# Patient Record
Sex: Female | Born: 1955 | Race: White | Hispanic: No | Smoking: Current every day smoker
Health system: Southern US, Community
[De-identification: ages and names within clinical notes are randomized; demographics above are authoritative.]

## PROBLEM LIST (undated history)

## (undated) DIAGNOSIS — E785 Hyperlipidemia, unspecified: Secondary | ICD-10-CM

## (undated) DIAGNOSIS — J45909 Unspecified asthma, uncomplicated: Secondary | ICD-10-CM

## (undated) DIAGNOSIS — Z860100 Personal history of colon polyps, unspecified: Secondary | ICD-10-CM

## (undated) DIAGNOSIS — M797 Fibromyalgia: Secondary | ICD-10-CM

## (undated) DIAGNOSIS — M1990 Unspecified osteoarthritis, unspecified site: Secondary | ICD-10-CM

## (undated) DIAGNOSIS — M109 Gout, unspecified: Secondary | ICD-10-CM

## (undated) DIAGNOSIS — I10 Essential (primary) hypertension: Secondary | ICD-10-CM

## (undated) DIAGNOSIS — F419 Anxiety disorder, unspecified: Secondary | ICD-10-CM

## (undated) DIAGNOSIS — M549 Dorsalgia, unspecified: Secondary | ICD-10-CM

## (undated) DIAGNOSIS — G8929 Other chronic pain: Secondary | ICD-10-CM

## (undated) DIAGNOSIS — I2510 Atherosclerotic heart disease of native coronary artery without angina pectoris: Secondary | ICD-10-CM

## (undated) DIAGNOSIS — N393 Stress incontinence (female) (male): Secondary | ICD-10-CM

## (undated) DIAGNOSIS — Z86711 Personal history of pulmonary embolism: Secondary | ICD-10-CM

## (undated) HISTORY — DX: Other chronic pain: G89.29

## (undated) HISTORY — DX: Dorsalgia, unspecified: M54.9

## (undated) HISTORY — DX: Gout, unspecified: M10.9

## (undated) HISTORY — PX: LUMBAR SPINE SURGERY: SHX701

## (undated) HISTORY — DX: Personal history of colon polyps, unspecified: Z86.0100

## (undated) HISTORY — DX: Personal history of pulmonary embolism: Z86.711

## (undated) HISTORY — DX: Hyperlipidemia, unspecified: E78.5

## (undated) HISTORY — PX: CHOLECYSTECTOMY: SHX55

## (undated) HISTORY — PX: CERVICAL DISC SURGERY: SHX588

## (undated) HISTORY — DX: Atherosclerotic heart disease of native coronary artery without angina pectoris: I25.10

## (undated) HISTORY — PX: TOTAL SHOULDER REPLACEMENT: SUR1217

## (undated) HISTORY — DX: Anxiety disorder, unspecified: F41.9

## (undated) HISTORY — PX: ABDOMINAL HYSTERECTOMY: SHX81

## (undated) HISTORY — DX: Essential (primary) hypertension: I10

## (undated) HISTORY — DX: Personal history of colonic polyps: Z86.010

## (undated) HISTORY — DX: Fibromyalgia: M79.7

## (undated) HISTORY — DX: Unspecified osteoarthritis, unspecified site: M19.90

## (undated) HISTORY — PX: TOTAL KNEE ARTHROPLASTY: SHX125

## (undated) HISTORY — DX: Stress incontinence (female) (male): N39.3

---

## 2006-11-06 ENCOUNTER — Ambulatory Visit (HOSPITAL_COMMUNITY): Admission: RE | Admit: 2006-11-06 | Discharge: 2006-11-06 | Payer: Self-pay | Admitting: Family Medicine

## 2006-11-14 ENCOUNTER — Encounter: Admission: RE | Admit: 2006-11-14 | Discharge: 2006-11-14 | Payer: Self-pay | Admitting: Family Medicine

## 2006-11-27 ENCOUNTER — Ambulatory Visit (HOSPITAL_COMMUNITY): Admission: RE | Admit: 2006-11-27 | Discharge: 2006-11-27 | Payer: Self-pay | Admitting: Family Medicine

## 2006-12-19 ENCOUNTER — Encounter (HOSPITAL_COMMUNITY): Admission: RE | Admit: 2006-12-19 | Discharge: 2007-01-18 | Payer: Self-pay | Admitting: Chiropractic Medicine

## 2007-09-14 ENCOUNTER — Inpatient Hospital Stay (HOSPITAL_COMMUNITY): Admission: EM | Admit: 2007-09-14 | Discharge: 2007-09-15 | Payer: Self-pay | Admitting: Emergency Medicine

## 2008-01-16 ENCOUNTER — Inpatient Hospital Stay (HOSPITAL_COMMUNITY): Admission: RE | Admit: 2008-01-16 | Discharge: 2008-01-21 | Payer: Self-pay | Admitting: Orthopedic Surgery

## 2008-01-19 ENCOUNTER — Encounter (INDEPENDENT_AMBULATORY_CARE_PROVIDER_SITE_OTHER): Payer: Self-pay | Admitting: Orthopedic Surgery

## 2008-01-21 ENCOUNTER — Ambulatory Visit: Payer: Self-pay | Admitting: Vascular Surgery

## 2008-01-21 ENCOUNTER — Encounter (INDEPENDENT_AMBULATORY_CARE_PROVIDER_SITE_OTHER): Payer: Self-pay | Admitting: Orthopedic Surgery

## 2008-01-30 ENCOUNTER — Inpatient Hospital Stay (HOSPITAL_COMMUNITY): Admission: EM | Admit: 2008-01-30 | Discharge: 2008-02-05 | Payer: Self-pay | Admitting: Emergency Medicine

## 2008-01-31 ENCOUNTER — Encounter (INDEPENDENT_AMBULATORY_CARE_PROVIDER_SITE_OTHER): Payer: Self-pay | Admitting: Pulmonary Disease

## 2008-03-04 ENCOUNTER — Encounter (HOSPITAL_COMMUNITY): Admission: RE | Admit: 2008-03-04 | Discharge: 2008-04-03 | Payer: Self-pay | Admitting: Orthopedic Surgery

## 2008-05-28 ENCOUNTER — Ambulatory Visit (HOSPITAL_COMMUNITY): Admission: RE | Admit: 2008-05-28 | Discharge: 2008-05-28 | Payer: Self-pay | Admitting: Internal Medicine

## 2009-01-26 ENCOUNTER — Encounter: Admission: RE | Admit: 2009-01-26 | Discharge: 2009-01-26 | Payer: Self-pay | Admitting: Orthopedic Surgery

## 2009-02-19 ENCOUNTER — Inpatient Hospital Stay (HOSPITAL_COMMUNITY): Admission: RE | Admit: 2009-02-19 | Discharge: 2009-02-20 | Payer: Self-pay | Admitting: Orthopedic Surgery

## 2009-07-12 ENCOUNTER — Inpatient Hospital Stay (HOSPITAL_COMMUNITY): Admission: EM | Admit: 2009-07-12 | Discharge: 2009-07-16 | Payer: Self-pay | Admitting: Emergency Medicine

## 2009-07-13 ENCOUNTER — Encounter (INDEPENDENT_AMBULATORY_CARE_PROVIDER_SITE_OTHER): Payer: Self-pay | Admitting: Interventional Cardiology

## 2010-01-02 ENCOUNTER — Encounter
Admission: RE | Admit: 2010-01-02 | Discharge: 2010-01-02 | Payer: Self-pay | Admitting: Physical Medicine and Rehabilitation

## 2010-05-26 ENCOUNTER — Encounter: Admission: RE | Admit: 2010-05-26 | Discharge: 2010-05-26 | Payer: Self-pay | Admitting: Internal Medicine

## 2010-08-20 ENCOUNTER — Inpatient Hospital Stay (HOSPITAL_COMMUNITY): Admission: RE | Admit: 2010-08-20 | Discharge: 2010-08-21 | Payer: Self-pay | Admitting: Orthopedic Surgery

## 2011-01-03 ENCOUNTER — Encounter: Payer: Self-pay | Admitting: Internal Medicine

## 2011-01-15 ENCOUNTER — Encounter: Payer: Self-pay | Admitting: Internal Medicine

## 2011-02-24 LAB — DIFFERENTIAL
Basophils Absolute: 0 10*3/uL (ref 0.0–0.1)
Basophils Relative: 0 % (ref 0–1)
Monocytes Relative: 5 % (ref 3–12)
Neutro Abs: 10.7 10*3/uL — ABNORMAL HIGH (ref 1.7–7.7)
Neutrophils Relative %: 75 % (ref 43–77)

## 2011-02-24 LAB — CBC
Hemoglobin: 15.7 g/dL — ABNORMAL HIGH (ref 12.0–15.0)
MCH: 30.1 pg (ref 26.0–34.0)
MCHC: 33.8 g/dL (ref 30.0–36.0)
Platelets: 194 10*3/uL (ref 150–400)
RBC: 4.42 MIL/uL (ref 3.87–5.11)
RDW: 13.4 % (ref 11.5–15.5)
RDW: 13.5 % (ref 11.5–15.5)
WBC: 9.8 10*3/uL (ref 4.0–10.5)

## 2011-02-24 LAB — URINALYSIS, ROUTINE W REFLEX MICROSCOPIC
Ketones, ur: 15 mg/dL — AB
Nitrite: POSITIVE — AB
Protein, ur: NEGATIVE mg/dL
Urobilinogen, UA: 1 mg/dL (ref 0.0–1.0)

## 2011-02-24 LAB — BASIC METABOLIC PANEL
BUN: 11 mg/dL (ref 6–23)
CO2: 24 mEq/L (ref 19–32)
Calcium: 10.1 mg/dL (ref 8.4–10.5)
Calcium: 8.6 mg/dL (ref 8.4–10.5)
Creatinine, Ser: 0.58 mg/dL (ref 0.4–1.2)
GFR calc Af Amer: >60 mL/min (ref >60)
GFR calc non Af Amer: >60 mL/min (ref >60)
Glucose, Bld: 101 mg/dL — ABNORMAL HIGH (ref 70–99)
Sodium: 138 mEq/L (ref 135–145)

## 2011-02-24 LAB — APTT: aPTT: 25 seconds (ref 24–37)

## 2011-02-24 LAB — PROTIME-INR
INR: 0.87 (ref 0.00–1.49)
Prothrombin Time: 12 seconds (ref 11.6–15.2)

## 2011-02-24 LAB — TYPE AND SCREEN: ABO/RH(D): A NEG

## 2011-02-24 LAB — SURGICAL PCR SCREEN: MRSA, PCR: NEGATIVE

## 2011-03-19 LAB — CARDIAC PANEL(CRET KIN+CKTOT+MB+TROPI)
Relative Index: INVALID (ref 0.0–2.5)
Total CK: 38 U/L (ref 7–177)
Total CK: 52 U/L (ref 7–177)
Troponin I: 0.02 ng/mL (ref 0.00–0.06)

## 2011-03-19 LAB — TROPONIN I: Troponin I: 0.01 ng/mL (ref 0.00–0.06)

## 2011-03-19 LAB — CK TOTAL AND CKMB (NOT AT ARMC): Relative Index: INVALID (ref 0.0–2.5)

## 2011-03-19 LAB — T4, FREE: Free T4: 0.85 ng/dL (ref 0.80–1.80)

## 2011-03-20 LAB — CBC
HCT: 42.3 % (ref 36.0–46.0)
Hemoglobin: 14.6 g/dL (ref 12.0–15.0)
MCHC: 34.6 g/dL (ref 30.0–36.0)
MCV: 88.4 fL (ref 78.0–100.0)
RBC: 4.79 MIL/uL (ref 3.87–5.11)
RDW: 13 % (ref 11.5–15.5)

## 2011-03-20 LAB — COMPREHENSIVE METABOLIC PANEL
BUN: 12 mg/dL (ref 6–23)
CO2: 31 mEq/L (ref 19–32)
Calcium: 8.9 mg/dL (ref 8.4–10.5)
Creatinine, Ser: 0.75 mg/dL (ref 0.4–1.2)
GFR calc non Af Amer: >60 mL/min (ref >60)
Glucose, Bld: 96 mg/dL (ref 70–99)
Sodium: 137 mEq/L (ref 135–145)
Total Protein: 6.6 g/dL (ref 6.0–8.3)

## 2011-03-20 LAB — URINALYSIS, ROUTINE W REFLEX MICROSCOPIC
Glucose, UA: NEGATIVE mg/dL
Ketones, ur: NEGATIVE mg/dL
Nitrite: NEGATIVE
Protein, ur: NEGATIVE mg/dL
pH: 6 (ref 5.0–8.0)

## 2011-03-20 LAB — DIFFERENTIAL
Eosinophils Absolute: 0.1 10*3/uL (ref 0.0–0.7)
Lymphocytes Relative: 34 % (ref 12–46)
Lymphs Abs: 2.8 10*3/uL (ref 0.7–4.0)
Monocytes Relative: 7 % (ref 3–12)
Neutro Abs: 4.8 10*3/uL (ref 1.7–7.7)
Neutrophils Relative %: 57 % (ref 43–77)

## 2011-03-20 LAB — POCT CARDIAC MARKERS: Troponin i, poc: <0.05 ng/mL (ref 0.00–0.09)

## 2011-03-20 LAB — PROTIME-INR: INR: 0.9 (ref 0.00–1.49)

## 2011-03-24 LAB — CBC
HCT: 43.5 % (ref 36.0–46.0)
Hemoglobin: 12.9 g/dL (ref 12.0–15.0)
MCHC: 34.9 g/dL (ref 30.0–36.0)
MCV: 88.1 fL (ref 78.0–100.0)
MCV: 89.1 fL (ref 78.0–100.0)
Platelets: 262 10*3/uL (ref 150–400)
RBC: 4.07 MIL/uL (ref 3.87–5.11)
RDW: 13.5 % (ref 11.5–15.5)
WBC: 11.4 10*3/uL — ABNORMAL HIGH (ref 4.0–10.5)
WBC: 14 10*3/uL — ABNORMAL HIGH (ref 4.0–10.5)

## 2011-03-24 LAB — URINALYSIS, ROUTINE W REFLEX MICROSCOPIC
Bilirubin Urine: NEGATIVE
Glucose, UA: NEGATIVE mg/dL
Hgb urine dipstick: NEGATIVE
Ketones, ur: NEGATIVE mg/dL
Protein, ur: NEGATIVE mg/dL
Urobilinogen, UA: 0.2 mg/dL (ref 0.0–1.0)

## 2011-03-24 LAB — PROTIME-INR
INR: 1 (ref 0.00–1.49)
Prothrombin Time: 13.1 seconds (ref 11.6–15.2)

## 2011-03-24 LAB — BASIC METABOLIC PANEL
BUN: 20 mg/dL (ref 6–23)
CO2: 24 mEq/L (ref 19–32)
Calcium: 8.3 mg/dL — ABNORMAL LOW (ref 8.4–10.5)
Chloride: 100 mEq/L (ref 96–112)
Chloride: 102 mEq/L (ref 96–112)
Creatinine, Ser: 0.67 mg/dL (ref 0.4–1.2)
Creatinine, Ser: 0.76 mg/dL (ref 0.4–1.2)
GFR calc Af Amer: >60 mL/min (ref >60)
Glucose, Bld: 95 mg/dL (ref 70–99)
Potassium: 4.4 mEq/L (ref 3.5–5.1)
Sodium: 136 mEq/L (ref 135–145)

## 2011-03-24 LAB — DIFFERENTIAL
Basophils Absolute: 0 10*3/uL (ref 0.0–0.1)
Basophils Relative: 0 % (ref 0–1)
Eosinophils Absolute: 0.1 10*3/uL (ref 0.0–0.7)
Eosinophils Relative: 1 % (ref 0–5)
Neutrophils Relative %: 62 % (ref 43–77)

## 2011-03-24 LAB — TYPE AND SCREEN: ABO/RH(D): A NEG

## 2011-03-24 LAB — ABO/RH: ABO/RH(D): A NEG

## 2011-04-26 NOTE — Group Therapy Note (Signed)
[name], [name]            ACCOUNT NO.:  [id number]   MEDICAL RECORD NO.:  [id number]          PATIENT TYPE:  INP   LOCATION:  A212                          FACILITY:  [facility]   PHYSICIAN:  Skeet Latch, DO    DATE OF BIRTH:  [birth year]   DATE OF PROCEDURE:  02/02/2008  DATE OF DISCHARGE:                                 PROGRESS NOTE   SUBJECTIVE:  [name] continues to improve.  The patient is still  complaining of some left calf and thigh pain today.  The patient has  been receiving warm compresses that she states helps her pain.  The  patient's condition seems to be stable at this time.   PHYSICAL EXAMINATION:  VITAL SIGNS:  Temperature is 98.0, pulse 87,  respirations 20, blood pressure 115/64, saturating 96% on room air.  CARDIOVASCULAR:  Regular rate and rhythm with no murmurs, rubs, or  gallops.  LUNGS:  Clear to auscultation bilaterally.  No rales, rhonchi or  wheezing.  ABDOMEN:  Obese, soft, nontender, nondistended, positive bowel sounds,  no rigidity or guarding.  EXTREMITIES:  Left lower extremity still has some swelling, still has  pain in the left calf and in her left thigh.  Right leg:  Trace edema.  No clubbing or cyanosis.   LABORATORIES:  White count 7.3, hemoglobin 10.9, hematocrit 31.2,  platelets 394.  Sodium 136, potassium 3.7, chloride 103, CO2 is 27,  glucose 111, BUN 15, creatinine 0.7.  PT 16.6, INR is 1.2.   ASSESSMENT AND PLAN:  1. Pulmonary embolism.  The patient continues to be on anticoagulation      with Lovenox and Coumadin.  INR is not in therapeutic range.  Her      Coumadin continues to be increased, is followed by pharmacy.  2. Left lower extremity deep vein thrombosis.  As stated, the patient      is on anticoagulation.  This will be continued and the patient will      continue to be on pain medication and supportive care for her left      lower leg pain.  3. For her hypertension, continue home medications.  4. For  hyperlipidemia, continue current medications.   The patient continues to be followed by pulmonology.      Skeet Latch, DO  Electronically Signed     SM/MEDQ  D:  02/02/2008  T:  02/02/2008  Job:  [id number]

## 2011-04-26 NOTE — Op Note (Signed)
[name], [name]            ACCOUNT NO.:  [id number]   MEDICAL RECORD NO.:  [id number]          PATIENT TYPE:  INP   LOCATION:  1525                         FACILITY:  [facility]   PHYSICIAN:  Ollen Gross, M.D.    DATE OF BIRTH:  02/26/1956   DATE OF PROCEDURE:  01/16/2008  DATE OF DISCHARGE:                               OPERATIVE REPORT   PREOPERATIVE DIAGNOSIS:  Osteoarthritis left knee.   POSTOPERATIVE DIAGNOSIS:  Osteoarthritis left knee.   PROCEDURE:  Left total knee arthroplasty.   SURGEON:  Ollen Gross, M.D.   ASSISTANT:  Avel Peace PA-C.   ANESTHESIA:  General.   ESTIMATED BLOOD LOSS:  Minimal.   DRAIN:  None.   TOURNIQUET TIME:  38 minutes at 300 mmHg.   COMPLICATIONS:  None.   CONDITION:  Stable to recovery.   BRIEF CLINICAL NOTE:  [name] is a 55 year old female who has severe left  knee pain with progressively worsening pain and dysfunction.  She has  significant bone-on-bone arthritic changes in that knee.  She has had  arthroscopy with Dr. Eulah Pont before without benefit.  She has had  injections without benefit.  She presents now for total knee  arthroplasty due to intractable pain.   PROCEDURE IN DETAIL:  After successful administration of general  anesthetic, a tourniquet was placed high on her left thigh and her left  lower extremity is prepped and draped in usual sterile fashion.  Extremity is wrapped in Esmarch, knee flexed and tourniquet inflated to  300 mmHg.  Midline incision made with 10 blade through subcutaneous  tissue to level of the extensor mechanism.  A fresh blade is used to  make a medial parapatellar arthrotomy.  Soft tissue over the proximal  and medial tibia is subperiosteally elevated to the joint line with the  knife and into the semimembranosus bursa with a Cobb elevator.  Soft  tissue laterally is elevated with attention being paid to avoid the  patellar tendon on tibial tubercle.  Patella subluxed laterally, knee  flexed  90 degrees, ACL and PCL removed.  Drill was used to create a  starting hole in the distal femur and the canal was thoroughly  irrigated.  A 5 degree left valgus alignment guide is placed and  referencing off the posterior condyles, rotations marked and a block  pinned to remove 10 mm of the distal femur.  Distal femoral resection is  made with an oscillating saw.  Sizing blocks placed and size 3 is most  appropriate.  Rotation is marked off the epicondylar axis.  Size 3  cutting blocks placed and the anterior, posterior and chamfer cuts made.   Tibia is subluxed forward and the menisci are removed.  Extramedullary  tibial alignment guide is placed referencing proximally at the medial  aspect of the tibial tubercle and distally along the second metatarsal  axis and tibial crest.  A block is pinned to remove 10 mm off the  nondeficient lateral side.  Tibial resection is made with an oscillating  saw.  Size 4 is the most appropriate tibial component and the proximal  tibia prepared with the  modular drill and keel punch for the size 4.  Femoral preparation is completed with the intercondylar cut for the size  3.   Size 4 mobile bearing tibial trial, size 3 posterior stabilized femoral  trial, and a 10 mm posterior stabilized rotating platform insert trial  are placed.  With the 10, full extension is achieved with excellent  varus and valgus balance throughout full range of motion.  There is also  excellent AP balance.  The patella is then everted and thickness  measured to be at 24 mm.  Freehand resection is taken to 14 mm, 35  template is placed, lug holes are drilled, trial patella is placed and  it tracks normally.  Osteophytes are removed off the posterior femur  with the trial in place.  All trials are removed and the cut bone  surfaces are prepared with pulsatile lavage.  Cement is mixed and once  ready for implantation, the size 4 mobile bearing tibial tray, size 3  posterior  stabilized femur and 35 patella are cemented into place.  Patella is held with a clamp.  Trial 10 mm insert is  placed, knee held  in full extension and all extruded cement removed.  When the cement is  fully hardened, then the wound is  copiously irrigated with saline  solution and the FloSeal injected in the posterior capsule.  The  permanent 10 mm posterior stabilized rotating platform insert is then  placed into the tibial tray.  The FloSeal is also injected in the  suprapatellar area and medial gutter.  Moist sponge is placed and  tourniquet released with total time of 38 minutes.  Minimal bleeding is  encountered and that which is encountered is stopped with  electrocautery.  The wound is irrigated again and then the extensor  mechanism closed with interrupted #1 PDS.  Flexion against gravity is  135 degrees.  Subcu is closed with interrupted 2-0 Vicryl and  subcuticular with running 4-0 Monocryl.  Incision is cleaned and dried  and Steri-Strips and a bulky sterile dressing applied.  She is placed  into a knee immobilizer, awakened and transported to recovery in stable  condition.      Ollen Gross, M.D.  Electronically Signed     FA/MEDQ  D:  01/16/2008  T:  01/17/2008  Job:  [id number]

## 2011-04-26 NOTE — Group Therapy Note (Signed)
[name], [name]            ACCOUNT NO.:  [id number]   MEDICAL RECORD NO.:  [id number]          PATIENT TYPE:  INP   LOCATION:  A212                          FACILITY:  [facility]   PHYSICIAN:  Skeet Latch, DO    DATE OF BIRTH:  [birth year]   DATE OF PROCEDURE:  02/03/2008  DATE OF DISCHARGE:                                 PROGRESS NOTE   SUBJECTIVE:  [name] states that she ambulated last evening.  The  patient still has some pain in her left lower leg that radiates to her  left thigh.  The patient also states that she had some chest pain last  evening that has continued to this morning.  She states it is a mid  sternal type pain that she had when she presented to the hospital.  It  does not appear to be a severe chest pain but seems to be waxing and  waning.   PHYSICAL EXAM:  VITAL SIGNS:  Temperature is 98.0, pulse 83,  respirations 20, blood pressure 113/74, she is sating 95% on room air.  CARDIOVASCULAR:  Regular rate and rhythm.  No murmurs, rubs or gallops.  LUNGS:  Clear.  No rales, rhonchi or wheezing.  ABDOMEN:  Obese, soft, nontender, nondistended.  Positive bowel sounds.  No rigidity or guarding.  EXTREMITIES:  Left lower extremities still have swelling.  She has  decreased pain in her left calf and her left thigh.  Right leg has trace  edema, no clubbing or cyanosis.   LABS:  White count 5.4, hemoglobin 11.4, hematocrit 32.9, platelets  343,000.  Sodium 140, potassium 4.1, chloride 101, CO2 32, glucose 113,  BUN 15, creatinine 0.71.  PT is 18.6, INR is 1.5.   ASSESSMENT AND PLAN:  1. Pulmonary embolus.  Patient continues to be on Lovenox and      Coumadin.  INR is approaching therapeutic range, not in the 2 to 3      range at this time.  Will continue current treatment.  2. Lower left extremity deep vein thrombosis.  As stated, will      continue on anticoagulation, pain management and supportive care.  3. For her chest pain, a ventilation perfusion scan  has been ordered      at this time.  Will wait for results.  4. Hypertension and lipidemia.  Will continue her home medications.      Skeet Latch, DO  Electronically Signed     SM/MEDQ  D:  02/04/2008  T:  02/04/2008  Job:  [id number]

## 2011-04-26 NOTE — Op Note (Signed)
[name], [name]            ACCOUNT NO.:  [id number]   MEDICAL RECORD NO.:  [id number]          PATIENT TYPE:  INP   LOCATION:  2550                         FACILITY:  [facility]   PHYSICIAN:  Almedia Balls. Ranell Patrick, M.D. DATE OF BIRTH:  03-13-1956   DATE OF PROCEDURE:  DATE OF DISCHARGE:                               OPERATIVE REPORT   PREOPERATIVE DIAGNOSIS:  Right shoulder end-stage osteoarthritis.   POSTOPERATIVE DIAGNOSIS:  Right shoulder end-stage osteoarthritis.   PROCEDURE PERFORMED:  Right shoulder hemiarthroplasty using DePuy  system.   ATTENDING SURGEON:  Almedia Balls. Ranell Patrick, MD.   ASSISTANT:  Donnie Coffin. Dixon, PA-C   ANESTHESIA:  General anesthesia was used.   ESTIMATED BLOOD LOSS:  Less than 50 mL.   FLUID REPLACEMENT:  2000 mL of crystalloid.   INSTRUMENT COUNT:  Correct.   COMPLICATIONS:  None.   Perioperative antibiotics were given.   INDICATIONS:  The patient is a 55 year old female with a worsening right  shoulder pain secondary to arthritis.  The patient has had preoperative  x-ray and MRI scan indicating significant cartilage loss and osteophytes  within the shoulder.  The patient complains of debilitating pain,  desiring shoulder arthroplasty to restore function and eliminate her  pain.  Informed consent was obtained.   DESCRIPTION OF PROCEDURE:  After an adequate level of general anesthesia  was achieved, the patient was positioned in a modified beach-chair  position.  The right shoulder was sterilely prepped and draped in the  usual manner.  A deltopectoral approach was utilized starting at  coracoid process extending down to the anterior humeral shaft.  Dissection down through the subcutaneous tissue using Bovie.  Cephalic  vein identified and taken laterally to the deltoid.  The pectoralis  taken medially.  Conjoined tendon was taken medially were identified.  At the biceps groove, we Bovied and tied off the 3 sisters.  We went  ahead and released the  subscapularis directly off the biceps groove  using a needle tip Bovie.  Placed a #2 FiberWire sutures in a modified  Maison-Allen suture technique in the free end of subscapularis which we  freed up from the underlying capsule and the coracoid process.  We then  released the soft tissue off the neck of the humerus progressively  externally rotating.  We then went ahead made a neck cut using neck cut  guide and cutting about 15 degrees of retroversion.  Once we had a neck  cut down, we reamed progressively up to a size 10 to accommodate a 10  prosthesis.  We went ahead and then broached for size 10 broach in  place.  We then placed a 48 x 21 eccentric head in place.  This gave an  excellent soft tissue balancing, able to translate posteriorly one half  the diameter of the glenoid and inferiorly one half the diameter of the  glenoid.  At this point, removed all trial components, placed a #2  FiberWire sutures in the lesser tuberosity in the shaft.  We went ahead  and then impacted the PressFit prosthesis into place.  This is a size 10  with  appropriate version and then placed the 48 x 21 eccentric with best  coverage obtained.  Excess osteophytes had been removed using a rongeur  and osteotome.  Excellent coverage was achieved with this implant.  Rotator cuff appeared to be in good shape with some adhesions which we  freed up.  At this point, we repaired the subscapularis back  anatomically using the sutures that were already in the shaft.  Also,  sutures placed in subscapularis through the rotator interval and up  through the rotator cuff and over the top in a mattress fashion and  rotator cuff as well.  We had excellent repair.  We oversewed the  rotator interval with two #2 FiberWire sutures figure-of-eight.  Following this, thorough irrigation followed by closure of deltopectoral  interval with 0-Vicryl suture followed by 2-0 Vicryl subcutaneous  closure and 4-0 running Monocryl for  skin.  Steri-Strips were applied  followed by sterile dressing.  The patient tolerated the surgery well.      Almedia Balls. Ranell Patrick, M.D.  Electronically Signed     SRN/MEDQ  D:  02/19/2009  T:  02/20/2009  Job:  [id number]

## 2011-04-26 NOTE — Discharge Summary (Signed)
[name], [name]            ACCOUNT NO.:  [id number]   MEDICAL RECORD NO.:  [id number]          PATIENT TYPE:  INP   LOCATION:  A311                          FACILITY:  [facility]   PHYSICIAN:  Skeet Latch, DO    DATE OF BIRTH:  25-Sep-1956   DATE OF ADMISSION:  09/14/2007  DATE OF DISCHARGE:  10/04/2008LH                               DISCHARGE SUMMARY   DISCHARGE DIAGNOSES:  1. Acute exacerbation of chronic obstructive pulmonary disease.  2. Bronchitis.  3. Dyspnea.   HISTORY OF PRESENT ILLNESS:  This is a 55 year old Caucasian female who  presented with a 3 day history of shortness of breath, wheezing, and  productive cough. The patient also had associated fevers and chills. She  stated that it became progressively worse in the last 3 days. The  patient also was complaining of some productive sputum that was greenish  in color. The patient does have a history of asthma and has been taking  albuterol and Ipratropium as directed for this. She was taking these  during this episode without relief. The patient came to the hospital for  evaluation. The patient had a chest x-ray performed in the emergency  room that showed a mild hyper-inflation with peribronchial thickening.  The patient was having moderate to severe shortness of breath, so she  was admitted to a general medical bed.   HOSPITAL COURSE:  The patient was admitted. The patient had sputum and  gram stains. This showed some positive cocci and clusters. The patient  was started on IV antibiotics as well as IV steroids. The patient was  given Duo-Neb treatment every 2 to 4 hours as needed. The patient was  placed on a nicotine patch secondary to her tobacco abuse. She was also  placed on GI as well as DVT prophylaxis. The patient's wheezing and  shortness of breath have improved. She still does have some wheezing.  The patient really wants to go home at this time. The patient will be  sent home on the following  medications.   DISCHARGE MEDICATIONS:  1. Ibuprofen 800 mg q.6 hours p.r.n.  2. Lipitor 40 mg daily.  3. Micardis HCT 40/12.5 daily.  4. Voltaren 4 grams topical b.i.d.  5. Albuterol inhaler 3 to 4 puffs daily as needed.  6. Levaquin 750 mg daily for 5 days.  7. Prednisone taper 60 mg daily for 2 days, 30 mg daily for 2 days, 15      mg daily for 2 days, 7.5 mg daily for 2 days, then 5 mg daily for 2      days, then stop.  8. Tussionex 5 ml q.12 hours p.r.n.   PHYSICAL EXAMINATION:  VITAL SIGNS:  Temperature 98.0, pulse 83,  respiratory rate 24, blood pressure 124/78. The is sating 94% on 2  liters.   LABORATORY DATA:  Sodium 138, potassium 3.6, chloride 103, CO2 25,  glucose 229, BUN 14, creatinine 0.87, white count 18.3. Hemoglobin and  hematocrit 12.2 and 35.1, platelets 250,000. Her ABG showed a pH of  7.406, pCO2 37.8, pO2 72.1. Bicarb 23.2.   CONDITION ON DISCHARGE:  Stable.  DISPOSITION:  The patient will be discharged to home.   ACTIVITY:  Increase activity slowly.   DIET:  The patient to maintain a low-salt, heart healthy diet.   FOLLOWUP:  With primary care physician within the next 7 to 10 days.   SPECIAL INSTRUCTIONS:  The patient was instructed to take her  medications as directed. The patient also instructed to stop all tobacco  products and receive therapy regarding tobacco cessation. She is  instructed to followup in the emergency room if she has any increasing  shortness of breath or severe problems.      Skeet Latch, DO  Electronically Signed     SM/MEDQ  D:  09/15/2007  T:  09/15/2007  Job:  [id number]

## 2011-04-26 NOTE — Group Therapy Note (Signed)
[name], [name]            ACCOUNT NO.:  [id number]   MEDICAL RECORD NO.:  [id number]          PATIENT TYPE:  INP   LOCATION:  A212                          FACILITY:  [facility]   PHYSICIAN:  Edward L. Juanetta Gosling, M.D.DATE OF BIRTH:  26-Jul-1956   DATE OF PROCEDURE:  DATE OF DISCHARGE:                                 PROGRESS NOTE   Patient of the [facility] Hospitalist Team.  [name] says she is  feeling well and wants to go home.  She wants to try to give herself  Lovenox injections at home and wait for her pro time to get therapeutic.  I think that is okay.  She does not have any other new complaints.  She is not short of breath now.  She is had no more chest pain.  Her  ventilation perfusion lung scan yesterday, she only did the perfusion  portion, but that was really all that was totally necessary and it  showed no change, so I do not think she has had more clots.   PHYSICAL EXAM:  Shows her temperature is 98.3, pulse 86, respirations  18, blood pressure 121/64, O2 sats 97% on room air.  Her INR is 1.5.   ASSESSMENT:  I think it would be okay for her to go.  I will discuss her  situation with the hospitalist who is on do today and see what their  thoughts are about this.      Edward L. Juanetta Gosling, M.D.  Electronically Signed     ELH/MEDQ  D:  02/05/2008  T:  02/05/2008  Job:  [id number]

## 2011-04-26 NOTE — Group Therapy Note (Signed)
NAMEARRYANNA, HOLQUIN            ACCOUNT NO.:  [id number]   MEDICAL RECORD NO.:  [id number]          PATIENT TYPE:  INP   LOCATION:  A212                          FACILITY:  [facility]   PHYSICIAN:  Skeet Latch, DO    DATE OF BIRTH:  [birth year]   DATE OF PROCEDURE:  02/01/2008  DATE OF DISCHARGE:                                 PROGRESS NOTE   SUBJECTIVE:  The patient stable at this time.  The patient complaining  of left leg pain and does not want any pain medication on my exam.  The  patient went to [facility] for a venous Doppler yesterday  and was found to have a left lower extremity DVT in the distal  femoral/popliteal vein.  THE PATIENT continues to be on Lovenox and  Coumadin at this time.   OBJECTIVE:  VITAL SIGNS:  Temperature 96.5, respirations 22, heart rate  74, blood pressure 112/62.  She is satting 97% on room air.   PHYSICAL EXAM:  CARDIOVASCULAR:  Regular rate and rhythm.  No murmurs,  rubs, or gallops.  RESPIRATORY:  Clear to auscultation bilaterally.  No rhonchi, rales, or  wheezing.  ABDOMEN:  Obese, soft, nontender, nondistended.  Positive bowel sounds.  No rigidity or guarding.  EXTREMITIES:  Left lower extremity still had swelling in her left knee  status post surgery and swelling to the center left entire calf region  with some bruising noted.  Right lower extremity is with no swelling,  erythema, or edema.   LABS:  Hemoglobin 11.0, hematocrit 31.3, white count 7.0, platelets  410,000.  Sodium 137, potassium 3.5, chloride 104, CO2 26, BUN 13,  creatinine 0.65, glucose 92.  PT is 15.8, INR 1.3.  Calcium 8.9.   ASSESSMENT AND PLAN:  1. Pulmonary embolism.  The patient continues to be on Lovenox 1 mg      per kg twice daily as well as Coumadin per pharmacy protocol.  The      patient will receive daily PT/INR for target range of 2 to 3.      Pulmonology has also been consulted and following the patient.  2. Left lower extremity deep vein  thrombosis.  Continue current      treatment.  Probably source of her pulmonary embolism at this time.      The patient is status post left knee arthroplasty in the last few      weeks.  3. Hypertension.  Continue her home medications.  4. Hyperlipidemia.  Also, continue home medications.  Continue to      follow the patient.      Skeet Latch, DO  Electronically Signed     SM/MEDQ  D:  02/01/2008  T:  02/01/2008  Job:  [id number]

## 2011-04-26 NOTE — H&P (Signed)
NAMEHAISLEY, ARENS            ACCOUNT NO.:  [id number]   MEDICAL RECORD NO.:  [id number]          PATIENT TYPE:  INP   LOCATION:  A311                          FACILITY:  [facility]   PHYSICIAN:  Skeet Latch, DO    DATE OF BIRTH:  Aug 17, 1956   DATE OF ADMISSION:  09/14/2007  DATE OF DISCHARGE:  LH                              HISTORY & PHYSICAL   HISTORY AND PHYSICAL   CHIEF COMPLAINT:  Shortness of breath.   HISTORY OF PRESENT ILLNESS:  Mrs. Conrow is a 55 year old, Caucasian  female who presents with a 3-day history of shortness of breath,  wheezing, productive cough, fever and chills.  Patient states that  approximately 3 days ago she began becoming increasingly short of breath  with some associated wheezing and production of greenish sputum.  Patient does have a history of asthma and has been taking her albuterol  and ipratropium as directed and states that this did not relieve her  symptoms.  Patient states after 3 days she decided to come in to be  evaluated in the Emergency Room.   PAST MEDICAL HISTORY:  1. Asthma.  2. Hypertension.  3. Chronic back pain.  4. Bronchitis.  5. Hyperlipidemia.  6. Osteoarthritis.   PAST SURGICAL HISTORY:  1. Appendectomy.  2. Back surgery.  3. Cholecystectomy.  4. Hysterectomy.  5. Left knee surgery.  6. Right rotator cuff repair.   SOCIAL HISTORY:  Drinker.  Denies any illicit drug use.  She is a one-  pack per day smoker for over 30 years.   DRUG ALLERGIES:  1. OXYCODONE.  2. MORPHINE.  3. METHADONE.  4. LIDOCAINE.  5. LATEX.  6. ELAVIL.  7. PENICILLIN.  8. KEFLEX.   HOME MEDICATIONS:  1. Micardis HCT40/12.5 mg once a day.  2. Lipitor 40 mg daily.  3. Voltaren ointment twice a day.  4. Albuterol inhaler as needed.  5. Ipratropium bromine inhaler as needed.  6. Motrin 800 mg q.i.d. p.r.n.   REVIEW OF SYSTEMS:  CONSTITUTIONAL:  No weight loss, weakness or  decreased appetite.  Positive for fever and chills.   HEENT:  No eye  pain, diplopia, ear pain, sore throat, hoarseness or sinus problem.  CARDIOVASCULAR:  No chest pain, palpitations.  RESPIRATORY:  Positive  for cough, dyspnea, wheezing.  GASTROINTESTINAL:  No nausea, vomiting,  diarrhea or abdominal pain.  GENITOURINARY:  No dysuria, urgency, or  flank pain.  MUSCULOSKELETAL:  Positive for chronic back pain and  osteoarthritis.  No neck pain.  SKIN:  No rashes, pruritus, blisters or  bruising.  NEUROLOGIC:  No headaches, altered mental status changes.  All other systems are negative.   PHYSICAL EXAMINATION:  GENERAL:  Patient is alert and awake.  Pleasant  and cooperative.  No acute distress.  Looks her stated age.  VITAL  SIGNS:  Temperature is 97.5, pulse 85, respirations 16, blood pressure  131/59, O2 sat 94 percent on room air.  HEENT:  Head is atraumatic,  normocephalic.  EYES:  PERRLA, EOMI.  NECK:  Supple, nontender,  nondistended.  No thyromegaly.  No JVD.  CARDIOVASCULAR:  Regular rate  and rhythm.  No murmurs, rubs or gallops.  RESPIRATORY:  Coarse sounds  throughout.  Also positive wheezing scattered on expiration with  decreased air movement bilaterally.  No rales or rhonchi.  ABDOMEN:  Obese, soft, nontender, nondistended, no masses, no rebound tenderness,  positive bowel sounds.  EXTREMITIES:  No clubbing, cyanosis or edema.  NEUROLOGIC:  Cranial nerves II through XII are grossly intact.  Patient  alert and oriented x3.  SKIN:  No rashes, petechiae, normal skin color.   LABS:  Sputum culture:  Preliminary shows __________ epithelial cells,  moderate WBCs.  Gram stain shows a few gram positive cocci clusters with  moderate gram positive cocci in chains.  Respiratory culture:  So far  negative.  Urinalysis:  Small amount of blood and no nitrates or  leukocytes.  Sodium 138, potassium 4.0, chloride 103, CO2 30, glucose  108, BUN 13, creatinine 0.68.  White count is 9,000.  Hemoglobin 13.1,  hematocrit 37.8, platelets 238, ABD  pH 7.47, PCO2 33.6, PO2 66.35,  bicarb 24.2.   RADIOLOGIC FINDINGS:  Chest x-ray shows mild hyperinflation and  peribronchial thickening.   IMPRESSION:  This is a 55 year old, Caucasian female who presents with a  3-day history of shortness of breath, wheezing, productive cough, fever  and chills.  Chest x-ray shows no pneumonia or emphysematous changes.  Patient does have a history of asthma and a history of tobacco abuse.   PLAN:  Patient will be admitted as in-patient for her shortness of  breath and wheezing.  Patient's sputum and gram stains do show some gram  positive cocci and clusters.  Patient will be started on IV antibiotics  as well as IV steroids.  Patient will receive DuoNeb treatments every 2  to 4 hours then 1 hour p.r.n.  Patient does not want nicotine patch at  this time.  Patient was placed on GI as well as DVT prophylaxis also.  Patient will be placed on her home medications for her hypertension,  hyperlipidemia and her osteoarthritis.  I anticipate patient being  discharged in the next 1 or 2 days if her shortness of breath and  wheezing improves.      Skeet Latch, DO  Electronically Signed     SM/MEDQ  D:  09/14/2007  T:  09/14/2007  Job:  [id number]

## 2011-04-26 NOTE — Group Therapy Note (Signed)
[name], [name]            ACCOUNT NO.:  [id number]   MEDICAL RECORD NO.:  [id number]          PATIENT TYPE:  INP   LOCATION:  A212                          FACILITY:  [facility]   PHYSICIAN:  Edward L. Juanetta Gosling, M.D.DATE OF BIRTH:  05/10/1956   DATE OF PROCEDURE:  DATE OF DISCHARGE:                                 PROGRESS NOTE   Patient of the [facility] Team.  [name] overall looks  much better.  She is having less pain in her leg and feels better.  She  has no other new problems.   PHYSICAL EXAMINATION:  Today temperature is 98.1, pulse 77, respirations  16, blood pressure 101/62 O2 sats 96% on room air.  Her leg looks better.  Her chest is clear.   She wants to stop her nebulizer treatments which I think is appropriate.   Her white blood count 6200, hemoglobin 11.6, platelets 356,000.  Her pro  time 16.5 with an INR of 1.3.  She still just not getting her INR out as  I would have expected, so she is going to need to stay until we get her  clotting situation straightened out.  I will go and discontinue  nebulizer treatments.      Edward L. Juanetta Gosling, M.D.  Electronically Signed     ELH/MEDQ  D:  02/03/2008  T:  02/03/2008  Job:  [id number]

## 2011-04-26 NOTE — Group Therapy Note (Signed)
NAMEMALAKA, RUFFNER            ACCOUNT NO.:  [id number]   MEDICAL RECORD NO.:  [id number]          PATIENT TYPE:  INP   LOCATION:  A212                          FACILITY:  [facility]   PHYSICIAN:  Edward L. Juanetta Gosling, M.D.DATE OF BIRTH:  12-09-1956   DATE OF PROCEDURE:  DATE OF DISCHARGE:                                 PROGRESS NOTE   Ms. Jablonski is having more chest pain and is very worried that she is  having more pulmonary emboli.  Unfortunately she has a very severe  reaction to contrast material so rather than doing a CT I am going to  repeat her ventilation perfusion lung scan. If it shows changes from the  previous that would indicate that she may have more clots then she is  going to need to have a vena cava filter placed.   PHYSICAL EXAMINATION:  VITALS:  Her exam shows her temperature is 98.8,  pulse 88, respirations 20, blood pressure 134/76, O2 sats 98% on room  air which would be some evidence against the fact that she might be  having pulmonary emboli.      Edward L. Juanetta Gosling, M.D.  Electronically Signed     ELH/MEDQ  D:  02/04/2008  T:  02/04/2008  Job:  [id number]

## 2011-04-26 NOTE — H&P (Signed)
NAMEWREATHA, STURGEON            ACCOUNT NO.:  [id number]   MEDICAL RECORD NO.:  [id number]          PATIENT TYPE:  INP   LOCATION:  A212                          FACILITY:  [facility]   PHYSICIAN:  Skeet Latch, DO    DATE OF BIRTH:  03-02-1956   DATE OF ADMISSION:  01/30/2008  DATE OF DISCHARGE:  LH                              HISTORY & PHYSICAL   CHIEF COMPLAINT:  Chest pain.   HISTORY OF PRESENT ILLNESS:  Ms. Costello is a 55 year old Caucasian  female who presents with a one day history of chest pain.  Patient  states that last evening, she started having the sudden onset of acute  chest pain.  She states that it was an 8/10.  Patient states that she  continued to have severe chest pain throughout the night and did not  take any medications for this chest pain.  Patient had a left total knee  arthroplasty on January 16, 2008, and at that time was placed on  Coumadin and has been receiving physical therapy.  The patient states  that up until last evening, she has not had any major complaints other  than some left knee pain secondary to surgery.  Patient states that this  pain continued through the evening until this morning and called for  advice.  It was recommended that patient come to the emergency room for  evaluation.  Upon being seen in the emergency room, a nuclear study  showed a single large perfusion ventilation mish-mash in the lingula, an  intermediate probability of acute pulmonary embolus.  Secondary to this,  the patient was admitted to the hospital.  On my exam, the patient  states that her chest pain has improved.  She states that it is a 2/10  at this time and denies any shortness of breath at this time.   PAST MEDICAL HISTORY:  1. Asthma.  2. Hypertension.  3. Chronic back pain.  4. Bronchitis.  5. Hyperlipidemia.  6. Osteoarthritis.   PAST SURGICAL HISTORY:  1. Left total knee arthroplasty done on January 16, 2008.  2. Appendectomy.  3. Back  surgery.  4. Cholecystectomy.  5. Hysterectomy.  6. Right rotator cuff repair.   SOCIAL HISTORY:  She is a social drinker.  Denies any illicit drug use  and recently quit smoking back in October.   DRUG ALLERGIES:  OXYCODONE, MORPHINE, METHADONE, LIDOCAINE, LATEX,  ELAVIL, PENICILLIN, KEFLEX.   HOME MEDICATIONS:  1. Restoril 15 mg 2 tabs p.o. nightly.  2. Dilaudid 2 mg 2 tablets p.o. q.4h.  3. Lipitor 20 mg once daily.  4. Methocarbamol 500 mg 1 tab p.o. q.6h.  5. Coumadin 2 mg Tuesday, Thursday, Saturday, and Coumadin 3 mg      Wednesday, Sunday, Monday.   REVIEW OF SYSTEMS:  CONSTITUTIONAL:  Weight loss, weight gain, weakness,  decreased appetite.  No fevers or chills.  HEENT:  Unremarkable.  CARDIOVASCULAR:  Positive for chest pain.  No palpitations.  RESPIRATORY:  No shortness of breath, cough, or wheezing.  GI:  No  nausea, vomiting, diarrhea, abdominal pain.  GENITOURINARY:  No dysuria,  urgency,  or frequency.  MUSCULOSKELETAL:  She is having some left knee  pain, status post surgery.  SKIN:  No rashes, pruritus, or blisters.  NEUROLOGIC:  No headaches.  No mental status changes.   PHYSICAL EXAMINATION:  VITAL SIGNS:  Temperature 97.8, pulse 81,  respirations 20, blood pressure 144/92.  She is satting at 98% on room  air.  GENERAL:  Patient is pleasant, cooperative, well-developed and well-  nourished, well hydrated.  No acute distress.  She is not short of  breath.  HEENT:  Head is normocephalic and atraumatic.  PERRLA.  Eyes are PERRLA,  EOMI.  NECK:  Soft and supple.  Nontender, nondistended.  No JVD.  No  thyromegaly.  CARDIOVASCULAR:  Regular rate and rhythm.  No murmurs, rubs or gallops.  RESPIRATORY:  Lungs seem clear.  No wheezes, rales or rhonchi noted.  ABDOMEN:  Obese, soft, nontender, nondistended.  No masses or rebound  tenderness.  Positive bowel sounds.  EXTREMITIES:  Left lower extremity slightly swollen.  She has bruising  noted.  Status post left  knee surgery with some erythema noted.  Pain on  palpation.  Right lower extremity:  Trace edema.  No clubbing, cyanosis  or erythema is noted.  NEUROLOGIC:  Cranial nerves II-XII are grossly intact.  Patient is alert  and oriented x3.   LABS:  Sodium 136, potassium 3.9, chloride 97, CO2 29, glucose 106, BUN  13, creatinine 0.67.  CK-MB is less than 1.  Troponin less than 0.05.  PTT is 37.  PT is 22.6.  INR is 1.9.  White count is 7.9, hemoglobin  11.8, hematocrit 34.1, platelet count 456.   ASSESSMENT:  This is a 55 year old Caucasian female who presents with a  one-day history of acute onset of chest pain.  Patient is found to have  pulmonary embolus due to perfusion scan and had a recent left total knee  arthroplasty done on January 16, 2008.   PLAN:  1. Patient will be admitted to the telemetry unit.  For her pulmonary      embolus, patient was already on Coumadin and is to be continued per      pharmacy protocol.  Add Lovenox 1 mg/kg q.12h.  We will also      consult pulmonology secondary to her pulmonary embolus at this      time.  I do not see the indication for IV antibiotics at this time.      We will hold at this time.  Patient will have daily PT/INR also.      Her INR needs to be in the 2-3 range.  For her chest pain, it is      probably secondary to her pulmonary embolus.  It seems to be      resolving.  Patient will have IV Dilaudid for her pain at this      time.  So far, her cardiac enzymes are negative.  2. Anemia, slight in nature.  If her hemoglobins continue to fall, we      will get stool studies as well as an anemia      panel.  3. Chronic pain:  Patient placed on her home medications and will      follow as needed.  Also, will get a PT/OT evaluation.  Patient will      be on a GI prophylaxis also.      Skeet Latch, DO  Electronically Signed     SM/MEDQ  D:  01/30/2008  T:  01/30/2008  Job:  [id number]

## 2011-04-26 NOTE — H&P (Signed)
[name], [name]            ACCOUNT NO.:  [id number]   MEDICAL RECORD NO.:  [id number]          PATIENT TYPE:  INP   LOCATION:  4733                         FACILITY:  [facility]   PHYSICIAN:  Della Goo, M.D. DATE OF BIRTH:  [birth year]   DATE OF ADMISSION:  07/11/2009  DATE OF DISCHARGE:                              HISTORY & PHYSICAL   PRIMARY CARE PHYSICIAN:  Ralene Ok, MD   CHIEF COMPLAINT:  Chest pain.   HISTORY OF PRESENT ILLNESS:  This is a 55 year old female presenting to  the emergency department with complaints of constant chest pain, which  she reports having for 2 days.  She describes a 10/10 mid chest area  pain, which is sharp and stabbing.  She does report having shortness of  breath, nausea, and vomiting x1 episode.  She denies having any  diaphoresis.  She denies having any fevers, chills, cough, or  hemoptysis.  She also denies having syncope, lightheadedness, or  seizures.  She denies diarrhea.   PAST MEDICAL HISTORY:  Significant for:  1. Hypertension.  2. Hyperlipidemia.  3. Chronic back pain.  4. Asthma.  5. Previous pulmonary embolism.   PAST SURGICAL HISTORY:  History of a right shoulder replacement in April  2010.  She reports having a left total knee replacement 1 year ago and  previous L4-L5 lumbar laminectomy, a hysterectomy, and an appendectomy.   MEDICATIONS:  1. Diazepam 10 mg 1 p.o. q.8 h. P.r.n.  2. Methocarbamol 500 mg p.o. b.i.d. p.r.n.  3. Benazepril 10 mg 1 p.o. daily.  4. Vicodin 10/325 mg 1 p.o. q.6 h. p.r.n. pain.  5. Lipitor 40 mg 1 p.o. daily.  6. TENS unit p.r.n.   ALLERGIES:  1. CELEBREX.  2. CODEINE.  3. ELAVIL.  4. KEFLEX.  5. PENICILLIN.  6. LATEX.  7. LIDOCAINE.  8. METHADONE.  9. MORPHINE.  10.NEURONTIN.  11.OXYCONTIN.  12.GLUTEN.  13.XYLOCAINE/LIDOCAINE.  14.LATEX.   SOCIAL HISTORY:  The patient is a smoker.  She reports smoking one pack  of cigarettes daily for 30 years.  She has quit for several  months at a  time.  She is a nondrinker.  She denies any illicit drug usage.  Family  history is positive for coronary artery disease in her father.  Hypertension in both parents.  No history of diabetes in her family.  Cancer in her father who had lung cancer and was exposed to asbestosis.   REVIEW OF SYSTEMS:  Pertinents are mentioned in the HPI.  All other  organ systems are negative.  The patient does have chronic back pain and  has current shoulder pain from her previous surgery.   PHYSICAL EXAMINATION FINDINGS:  GENERAL:  This is a 55 year old morbidly  obese female, in no discomfort or acute distress.  VITAL SIGNS:  Temperature 98.3, blood pressure 170/104, heart rate 96,  respirations 16, O2 saturation 97%.  HEENT:  Normocephalic and atraumatic.  Pupils equally round and reactive  to light.  Extraocular movements are intact.  Funduscopic benign.  Nares  are patent bilaterally.  Oropharynx is clear.  NECK:  Supple.  Full range of  motion.  No thyromegaly, adenopathy, or  jugular venous distention.  CARDIOVASCULAR:  Regular rate and rhythm.  No murmurs, gallops, or rubs.  Normal S1-S2.  LUNGS:  Clear to auscultation bilaterally.  ABDOMEN:  Positive bowel sounds.  Soft, nontender, and nondistended.  EXTREMITIES:  Without cyanosis, clubbing, or edema.  Neurologic  examination the patient is alert and oriented x3.  Motor and sensory  function are intact.  There are no focal deficits on examination.   LABORATORY STUDIES:  White blood cell count 8.4, hemoglobin 14.6,  hematocrit 42.3, and platelets 218.  Neutrophils 57%, and lymphocytes  34%.  Protime 12.7 and INR 0.9.  Sodium 137, potassium 3.6, chloride  101, carbon dioxide 31, BUN 12, creatinine 0.75 and glucose 96.  Albumin  3.4.  AST 21, ALT 20.  Point of care cardiac markers with a myoglobin of  49.6, CK-MB less than 1.0 and troponin less than 0.05.  Urinalysis is  negative.  Chest x-ray reveals no acute disease process.  EKG  reveals a  normal sinus rhythm without acute ST-segment changes.   ASSESSMENT:  A 55 year old female being admitted with:  1. Chest pain.  2. Hypertension.  3. Hyperlipidemia.  4. Chronic back pain.  5. Tobacco abuse.  6. Asthma .   PLAN:  The patient will be admitted to telemetry area for cardiac  monitoring.  Cardiac enzymes will be performed.  The patient has been  placed on nitro paste therapy, oxygen and aspirin therapy.  DVT and GI  prophylaxis have been ordered for now.  The patient's regular  medications have been reviewed and will be continued.  Further workup  will ensue pending results of the patient's clinical course and her  studies.      Della Goo, M.D.  Electronically Signed     HJ/MEDQ  D:  07/12/2009  T:  07/12/2009  Job:  [id number]   cc:   Ralene Ok, M.D.

## 2011-04-26 NOTE — Consult Note (Signed)
NAMEKITIARA, HINTZE            ACCOUNT NO.:  [id number]   MEDICAL RECORD NO.:  [id number]          PATIENT TYPE:  INP   LOCATION:  A212                          FACILITY:  [facility]   PHYSICIAN:  Edward L. Juanetta Gosling, M.D.DATE OF BIRTH:  [birth year]   DATE OF CONSULTATION:  01/30/2008  DATE OF DISCHARGE:                                 CONSULTATION   REFERRING PHYSICIAN:  [facility].   REASON FOR CONSULTATION:  Pulmonary embolus.   HISTORY:  Ms. Shear is a 55 year old who came to the hospital with a  history of chest pain.  She said that on the day prior to admission she  had the sudden onset of a severe chest pain that rated about an 8/10.  She did not treat it during the night, but she kept having problems, and  eventually came for evaluation to the emergency room.  She had a left  total knee arthroplasty on February 4.  She was placed on Coumadin.  She  had been receiving physical therapy.  She had done well postop, and was  doing PT, etc.  She ended up having a ventilation perfusion lung scan  that is read as intermediate probability for pulmonary embolus which  certainly fits her clinical situation.   PAST MEDICAL HISTORY:  1. Positive for asthma.  2. Hypertension.  3. Chronic back pain.  4. Bronchitis.  5. Hyperlipidemia.  6. Osteoarthritis.  7. She had a left total knee arthroplasty done January 16, 2008.  8. She has had an appendectomy.  9. Back surgery.  10.Cholecystectomy.  11.Hysterectomy.  12.Right rotator cuff repair.   SOCIAL HISTORY:  She does not use any illicit drugs.  She stopped  smoking October of 2008.   ALLERGIES:  She has multiple drug allergies which include OXYCODONE,  MORPHINE, METHADONE, LIDOCAINE, LATEX, ELAVIL, PENICILLIN, and KEFLEX.   CURRENT MEDICATIONS:  1. At home she takes Restoril 15 mg 2 tablets at night.  2. Dilaudid 2 mg two tablets q.4 h. as needed for pain.  3. Lipitor 20 mg daily.  4. Methocarbamol 500 mg q.6 h.  as needed.  5. Coumadin.   REVIEW OF SYSTEMS:  She has had pain in her knee of course, no actual  swelling.   PHYSICAL EXAM:  GENERAL:  Shows a well-developed, well-nourished female  who appears to be in no acute distress.  VITAL SIGNS:  Her temperature 97.8, pulse 81, respirations 20, blood  pressure is 140/90.  She looks fairly comfortable.  HEENT:  Her pupils are reactive.  Her nose and throat are clear.  NECK:  Supple without masses.  CHEST:  No wheezing, no rales, no rhonchi, and no rubs.  HEART:  Regular without gallop.  ABDOMEN:  Soft, no masses are felt.  EXTREMITIES:  Her knees have been replaced.   Her electrolytes are normal.  Her albumin is 3.3.  CBC showed white  count 7900, hemoglobin 11.8, platelets were 456.  PT 22.6 with an INR of  1.9, PTT was 37.  Cardiac markers negative.  She did not have a D-dimer  which she may need to have.  Chest  x-ray no acute findings.  Ventilation  perfusion lung scan showed one large mismatched area in the lingula  which is intermediate probability.  She did not have a CT; and, I think,  we should probably do that; I will discuss that with Dr. Lilian Kapur.  We  could also get a venous study to see if she has a clot, and that would  give [place] information as well.  Otherwise, I would continue with  anticoagulation.  She is fairly well anticoagulated so there is some  possibility that if things continue, she may actually need to have a  vena cava filter.  Otherwise I would plan to continue with her  treatments and medications as before and follow.      Edward L. Juanetta Gosling, M.D.  Electronically Signed     ELH/MEDQ  D:  01/30/2008  T:  01/31/2008  Job:  [id number]

## 2011-04-26 NOTE — Group Therapy Note (Signed)
[name], [name]            ACCOUNT NO.:  [id number]   MEDICAL RECORD NO.:  [id number]          PATIENT TYPE:  INP   LOCATION:  A212                          FACILITY:  [facility]   PHYSICIAN:  Edward L. Juanetta Gosling, M.D.DATE OF BIRTH:  July 21, 1956   DATE OF PROCEDURE:  02/02/2008  DATE OF DISCHARGE:                                 PROGRESS NOTE   [name] says she is having normal pain in her thigh now.  She did  have a clot on that side.  Her knee is doing okay.  She is not short of  breath.  She is not having any chest pain.   Physical exam shows her temperature is 98.7, pulse 89, respirations 20,  blood pressure 114/59, O2 sat is 96% on room air.  Her chest is clear.  Her thigh does show some tenderness along the area of Hunter's canal.  Considering that I think we should try to get her on some warm  compresses, etc.  Her lab work today, white blood count 7300, hemoglobin  is 10.9.  Her pro-time 15.6 with an INR of 1.2 so she is still not  anticoagulated with Coumadin so obviously she cannot go home as yet.  I  am going to ask for some warm compresses to be placed on her thigh.  I  think it is probably some inflammation from the thrombophlebitis not  increased clot, etc. since she is on the Lovenox.      Edward L. Juanetta Gosling, M.D.  Electronically Signed     ELH/MEDQ  D:  02/02/2008  T:  02/03/2008  Job:  [id number]

## 2011-04-26 NOTE — H&P (Signed)
NAMEBREINDEL, COLLIER            ACCOUNT NO.:  [id number]   MEDICAL RECORD NO.:  [id number]          PATIENT TYPE:  INP   LOCATION:  0011                         FACILITY:  [facility]   PHYSICIAN:  Ollen Gross, M.D.    DATE OF BIRTH:  [birth year]   DATE OF ADMISSION:  01/16/2008  DATE OF DISCHARGE:                              HISTORY & PHYSICAL   DATE OF OFFICE VISIT HISTORY AND PHYSICAL:  Performed on December 28, 2007.   CHIEF COMPLAINT:  Left knee pain.   HISTORY OF PRESENT ILLNESS:  The patient is a 55 year old female who has  seen in second opinion by Dr. Lequita Halt for ongoing severe left knee pain  that has been progressively getting worse for many years now.  She has  been seen by Dr. Richardson Landry and underwent an arthroscopy over the  summer.  She was told she had stage III arthritis and was bone-against-  bone in areas.  Unfortunately, since that time she has continued to  progress with severe pain.  It is felt she has reached a point where she  could benefit from undergoing surgical intervention.  Risks and benefits  have been discussed.  She elected to proceed with surgery.   ALLERGIES:  Multiple allergies including:  1. PENICILLIN causes swelling.  2. ELAVIL causes hallucinations.  3. KEFLEX causes diarrhea.  4. NEURONTIN causes rash.  5. WELLBUTRIN causes blackout spells.  6. FLEXERIL causes rash.  7. CODEINE causes hallucinations and itching.  8. VICODIN causes rash.  9. XYLOCAINE and LIDOCAINE caused a rash and throat closing local.  10.OXYCONTIN caused peripheral edema.  11.MORPHINE caused swelling and difficulty breathing.  12.Positive LATEX allergy.   CURRENT MEDICATIONS:  1. Albuterol inhaler.  2. Lipitor.  3. Micardis.  4. Hydrocodone.  5. EpiPen for bee stings.  6. Baby aspirin.  7. Lorcet or Lortab.   PAST MEDICAL HISTORY:  1. Chronic pain requiring pain clinic in the past.  2. Depression.  3. Asthma.  4. Bronchitis.  5. Pneumonia.  6. COPD.  7.  Hypertension.  8. Elevated cholesterol.  9. Varicose veins.  10.Hemorrhoids.  11.Irritable bowel syndrome.  12.Stress urinary incontinence.  13.Osteoporosis.  14.Degenerative disk disease.  15.Gout.  16.Childhood illnesses to include measles, mumps and rubella.   PAST SURGICAL HISTORY:  1. Appendectomy in 1980.  2. Gallbladder in 1981.  3. Right rotator cuff surgery x4.  4. L4-5 ruptured disk surgery.  5. Total hysterectomy.  6. Left knee scope.   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  Past smoking history of 2-  4 packs a day for 24 years; quit 4 months ago.  Occasional intake of  alcohol.  Three children.   FAMILY HISTORY:  Father deceased at age 82 with a heart attack and  asbestos cancer.  Mother deceased at age 67 with pacemaker and asthma.  Brother deceased at age 67 with liver cancer.  She has 3children.  An  aunt with cancer.  One uncle with cancer.  Grandmother with cancer.   REVIEW OF SYSTEMS:  GENERAL:  No fevers, chills, night sweats.  NEUROLOGIC:  No seizures, syncope  or paralysis.  RESPIRATORY:  A little  bit of shortness of breath on exertion.  No shortness of breath at rest.  No productive cough or hemoptysis.  CARDIOVASCULAR:  No chest pain,  angina or orthopnea.  GI:  No nausea, vomiting, diarrhea or  constipation.  Does have irritable bowel syndrome.  GU: Frequency,  nocturia.  No dysuria.  MUSCULOSKELETAL:  Joint pain and stiffness of  the left knee.   PHYSICAL EXAMINATION:  VITAL SIGNS:  Pulse 74, respirations 16, blood  pressure 120/82.  GENERAL:  A 55 year old white female, well-nourished, well-developed, in  no acute distress.  Good historian.  Overweight.  Tall frame.  Alert,  oriented and cooperative.  HEENT:  Normocephalic and atraumatic.  Pupils are round and reactive.  Oropharynx clear.  Extraocular movements intact.  NECK:  Supple.  CHEST:  Clear.  HEART:  Regular rate and rhythm.  No murmur.  S1, S2 noted.  ABDOMEN:  Soft, round,  protuberant.  Bowel sounds present.  RECTAL/BREAST/GENITALIA:  Not done; not pertinent to present illness.  EXTREMITIES:  Left knee with moderate varus malalignment; no  instability.  Range of motion 10 to 95.  Tender medial greater than  lateral.  Marked crepitus.   IMPRESSION:  1. Osteoarthritis, left knee.  2. History of depression.  3. Asthma.  4. History of bronchitis.  5. History of pneumonia.  6. Chronic obstructive pulmonary disease.  7. Hypertension.  8. Chronic pain requiring pain clinic in the past.  9. Hypercholesterolemia.  10.Varicose veins.  11.Hemorrhoids.  12.Irritable bowel syndrome.  13.Stress urinary incontinence.  14.Osteoporosis.  15.Degenerative disk disease.  16.History of gout.   PLAN:  The patient will be admitted to [facility] to undergo a  left total knee replacement arthroplasty.  Surgery will be performed by  Ollen Gross.  She has been seen preoperatively by Dr. Ralene Ok and  cleared for surgery.      Alexzandrew L. Perkins, P.A.C.      Ollen Gross, M.D.  Electronically Signed    ALP/MEDQ  D:  01/15/2008  T:  01/16/2008  Job:  [id number]   cc:   Ralene Ok, M.D.  Fax: [phone]

## 2011-04-26 NOTE — Group Therapy Note (Signed)
[name]            ACCOUNT NO.:  [id number]   MEDICAL RECORD NO.:  [id number]          PATIENT TYPE:  INP   LOCATION:  A212                          FACILITY:  [facility]   PHYSICIAN:  Skeet Latch, DO    DATE OF BIRTH:  12/02/1956   DATE OF PROCEDURE:  02/03/2008  DATE OF DISCHARGE:                                 PROGRESS NOTE   SUBJECTIVE:  [name] continues to improve.  Patient still has leg  pain in her left calf and left thigh region and slight edema.  Patient  continues to have warm compresses.  Overall, she denies any chest pain  or shortness of breath at this time.   OBJECTIVE:  VITAL SIGNS:  Temperature 98.1, pulse 77, respirations 16,  blood pressure 101/62, sating 96% on room air.  LUNGS:  Clear to auscultation bilaterally.  No wheezing, rhonchi or  rales.  CARDIOVASCULAR:  Regular rate and rhythm, no murmurs, rubs, or gallops.  ABDOMEN:  Obese, soft, nontender, nondistended, positive bowel sounds.  EXTREMITIES:  The left lower leg has some bruising noted, edema and pain  in her calf and her left thigh.  Right lower extremity has trace edema,  no pain.   LABORATORY DATA:  PT 16.5, INR 1.3.  White count 6.2, hemoglobin 11.6,  hematocrit 34, platelets 356.   ASSESSMENT/PLAN:  1. Pulmonary embolism.  Patient's INR still is not in therapeutic      range.  She continues to be on Lovenox and her Coumadin continues      to be adjusted.  2. Left lower extremity deep venous thrombosis.  As above with Lovenox      and Coumadin and warm compresses.  Patient's pain still present but      seems to be slightly improved.  3. Hypertension and hyperlipidemia.  Will continue her current home      medications.  Will consider, if patient's INR does not come in      therapeutic range in the next one to two days, patient may be sent      home on Lovenox and Coumadin with outpatient follow-up with her      INRs.      Skeet Latch, DO  Electronically  Signed     SM/MEDQ  D:  02/03/2008  T:  02/03/2008  Job:  [id number]

## 2011-04-26 NOTE — Group Therapy Note (Signed)
[name], [name]            ACCOUNT NO.:  [id number]   MEDICAL RECORD NO.:  [id number]          PATIENT TYPE:  INP   LOCATION:  A212                          FACILITY:  [facility]   PHYSICIAN:  Skeet Latch, DO    DATE OF BIRTH:  [birth year]   DATE OF PROCEDURE:  01/31/2008  DATE OF DISCHARGE:                                 PROGRESS NOTE   SUBJECTIVE:  The patient states that she is feeling better today.  The  patient denies any shortness of breath or pleuritic chest pain today.  The patient denies any abdominal pain, nausea, vomiting or diarrhea.  The patient still has some slight discomfort in her lower left extremity  with some calf tenderness and left knee pain. She is status post left  total knee arthroplasty.   OBJECTIVE:  VITAL SIGNS:  Temperature is 98.2, pulse 83, respirations  12, blood pressure 103/62.  She is satting 94% on room air.   PHYSICAL EXAMINATION:  CARDIOVASCULAR:  Regular rate and rhythm.  No  murmurs or gallops.  LUNGS:  Are clear to auscultation bilaterally.  No rales, rhonchi or  wheezing.  ABDOMEN:  Obese, soft, nontender, nondistended.  Positive bowel sounds.  No rigidity or guarding.  EXTREMITIES:  Left lower extremity-She has  left knee swelling with erythema secondary to her recent knee surgery.  She has some left calf tenderness with some bruising noted.  Right lower  extremity has trace edema.  No erythema or pain.  NEUROLOGIC:  Alert, awake, pleasant, cooperative.  No acute distress.   LABORATORY DATA:  Phosphorus of 5.5, magnesium 2.1, sodium 141,  potassium 3.7, chloride 103, CO2 is 32, glucose 110, BUN 14, creatinine  0.79, PTT is 37, her PT is 20.1, INR is 1.6, white count 6.5, hemoglobin  10.8, hematocrit 30.2, platelet count 376.   ASSESSMENT/PLAN:  1. Pulmonary embolus.  The patient would be continued on Lovenox 1 mg      per kilogram q.12 h as well as Coumadin.  I will get lower      extremity Dopplers to rule out on deep vein  thrombosis especially      in her left lower extremity.  The patient to be continued on pain      medications as needed.  Pulmonology has been consulted also      regarding her pulmonary embolus.  Try to keep her O2 saturations      above 92%.  2. Anemia.  Will continue to monitor her hemoglobin and hematocrit.  3. Chronic pain.  Will continue to monitor. It seems to be stable at      this time.  The patient will continue to receive PT, OT evaluation      on a daily basis.      Skeet Latch, DO  Electronically Signed    SM/MEDQ  D:  01/31/2008  T:  01/31/2008  Job:  [id number]

## 2011-04-26 NOTE — Group Therapy Note (Signed)
[name], FAUX            ACCOUNT NO.:  [id number]   MEDICAL RECORD NO.:  [id number]          PATIENT TYPE:  INP   LOCATION:  A212                          FACILITY:  [facility]   PHYSICIAN:  Edward L. Juanetta Gosling, M.D.DATE OF BIRTH:  09-Aug-1956   DATE OF PROCEDURE:  DATE OF DISCHARGE:                                 PROGRESS NOTE   Ms. Scalese says she is feeling a little better.  She is not short of  breath now.  She is not having any pleuritic chest pain and she overall  has improved.  She wants me to discuss her situation with Dr. Lequita Halt  who did her surgery so that I can tell him what has happened and I have  done that. He says that she had a Doppler study before she left the  hospital that was negative for clot.   PHYSICAL EXAMINATION:  VITALS:  Shows her temperature is 98.2, pulse 84,  respirations 20, blood pressure 122/83, O2 sats 96% on room air.  She is  set for another Doppler of her leg.   ASSESSMENT:  She has probably DVT with bleeding to a pulmonary embolus  post knee replacement.   PLAN:  To continue with anticoagulation, get the Doppler study and  hopefully will have more information.    Same patient of the 20th Ms. Fasnacht has had her Doppler study that  does show a clot which is not unexpected.  Otherwise she says she feels  very well.  Her knees giving her some trouble with otherwise good.  Her  temperature is 96.5, pulse 74, respirations 22, blood pressure 112/62,  O2 sats 97% on room air her chest is much clearer.  Her hemoglobin  levels 11, platelets 410, INR 16.8.   Protime 16.8, INR of 1.3 we need to get her better anticoagulated.   ASSESSMENT:  She is improved.   PLAN:  Continue with anticoagulation and follow.      Edward L. Juanetta Gosling, M.D.  Electronically Signed     ELH/MEDQ  D:  02/01/2008  T:  02/01/2008  Job:  [id number]

## 2011-04-26 NOTE — Group Therapy Note (Signed)
NAMEJAYDEE, [name]            ACCOUNT NO.:  [id number]   MEDICAL RECORD NO.:  [id number]          PATIENT TYPE:  INP   LOCATION:  A212                          FACILITY:  [facility]   PHYSICIAN:  Edward L. Juanetta Gosling, M.D.DATE OF BIRTH:  [birth year]   DATE OF PROCEDURE:  DATE OF DISCHARGE:                                 PROGRESS NOTE   [name] has had her Doppler study and it does show a clot which is  not unexpected.  Otherwise she says she feels very well.  Her knee is  giving her some trouble but otherwise is good.   PHYSICAL EXAMINATION:  VITALS:  Her temperature is 96.5, pulse 74,  respirations 22, blood pressure 112/62, O2 sats 97% on room air.  CHEST:  Much clearer.   LABORATORY DATA:  Her hemoglobin level 11, platelets 410, Protime 16.8,  INR of 1.3. We need to get her better anticoagulated.   ASSESSMENT:  She is improved.   PLAN:  Continue with anticoagulation and follow.      Edward L. Juanetta Gosling, M.D.  Electronically Signed     ELH/MEDQ  D:  02/01/2008  T:  02/01/2008  Job:  [id number]

## 2011-04-26 NOTE — Discharge Summary (Signed)
[name], [name]            ACCOUNT NO.:  [id number]   MEDICAL RECORD NO.:  [id number]          PATIENT TYPE:  INP   LOCATION:  4733                         FACILITY:  [facility]   PHYSICIAN:  Beckey Rutter, MD  DATE OF BIRTH:  14-Feb-1956   DATE OF ADMISSION:  07/11/2009  DATE OF DISCHARGE:  07/16/2009                               DISCHARGE SUMMARY   PRIMARY CARE PHYSICIAN:  Ralene Ok, MD   CHIEF COMPLAINT:  Chest pain.   BRIEF HISTORY OF PRESENT ILLNESS:  A 55 year old pleasant Caucasian  female admitted for chest pain.   During the hospital stay, the patient was primary ruled out for acute  coronary syndrome.  This stress test is essentially negative as below.  The patient was also work to rule out the diagnosis of pulmonary  embolism with V/Q scan, which is showing low probability.  The patient  had an MR as well with no evidence of aortic dissection or aneurysm.  The patient is stable for discharge today.  She was advised to follow up  with Dr. Ludwig Clarks.   The patient has abnormal thyroid function test compatible with  subclinical hypothyroidism.  Again, the patient was advised to follow up  with Dr. Ludwig Clarks to repeat the thyroid function test.  Please see the  lab test results.  Lab test; TSH done on July 12, 2009 showing thyroid  stimulating hormone is 7.10 with the normal range to our lab is 0.3-4.5,  i.e. slight elevation.  The TSH is a slight elevated; nevertheless, the  T4 is within the normal range.  The free T4 is 0.85 with a normal range  to our lab 0.8-1.8.  T3 is 2.7 within normal reference to our lab 2.3-  4.2.   Her troponin is negative.  Her BNP is less than 30.  Her sodium on July 11, 2009 is 137, potassium 3.6, chloride 101, bicarb 31, glucose 96, BUN  12, creatinine 0.75.  Estimated GFR is less than 60.  The alkaline  phosphatase is 71, AST is 21, ALT is 20, and total protein 6.6.   Chest x-ray on July 12, 2009; impression is no active  disease.  V/Q scan on July 12, 2009 showing low probability V/Q scan for  pulmonary embolism.  MRI of chest on July 14, 2009; impression is no evidence of thoracic  aortic dissection or aneurysm.  No demonstrated acute finding.  This  stress test on July 15, 2009.  The EKG part is within normal and the  nuclear part, impression is no evidence for pharmacological-induced  myocardial ischemia.  Normal left ventricular systolic function.  Left  ventricular ejection fraction estimated 63%.   DISCHARGE DIAGNOSES:  1. Chest pain ruled out for acute coronary syndrome.  2. Hypertension.  3. Hyperlipidemia.  4. Chronic back pain.  5. Bronchial asthma.  6. History of pulmonary embolism.  7. Abnormal thyroid function test compatible with subclinical      hypothyroidism.   DISCHARGE MEDICATIONS:  1. Diazepam 10 mg p.o. q.8 h. p.r.n.  2. Methocarbamol 500 mg p.o. b.i.d. p.r.n.  3. Benazepril 10 mg p.o. daily.  4. Vicodin 10/325 mg one tablet q.6 h. p.r.n. for pain.  5. Lipitor 40 mg p.o. daily.  6. Tens p.r.n.   DISCHARGE FOLLOWUP AND PLANS:  I discussed with the patient the finding  of the thyroid results.  I also discussed with her the finding of the  stress test and the chest MRI.  I advised the patient to follow up with  Dr. Ralene Ok within a week or two to further reevaluate the thyroid  function test.  She is aware and agreeable to this discharge and  followup plans.      Beckey Rutter, MD  Electronically Signed     EME/MEDQ  D:  07/16/2009  T:  07/16/2009  Job:  [id number]   cc:   Ralene Ok, M.D.

## 2011-04-26 NOTE — Discharge Summary (Signed)
[name], [name]            ACCOUNT NO.:  [id number]   MEDICAL RECORD NO.:  [id number]          PATIENT TYPE:  INP   LOCATION:  A212                          FACILITY:  [facility]   PHYSICIAN:  Skeet Latch, DO    DATE OF BIRTH:  1956-02-01   DATE OF ADMISSION:  01/30/2008  DATE OF DISCHARGE:  02/24/2009LH                               DISCHARGE SUMMARY   DISCHARGE DIAGNOSES:  1. Pulmonary embolus.  2. Lower left extremity deep vein thrombosis.  3. History hypertension.  4. History of hyperlipidemia.  5. Status post left knee arthroplasty   BRIEF HOSPITAL COURSE:  This is a 55 year old Caucasian female who  presented after having a 1-day history of chest pain.  The patient  stated the evening prior to being admitted she was having chest pain  that she stated was 810.  The patient continued to have chest pain  throughout the night and into the next day.  The patient had a left  total knee arthroplasty done on January 16, 2008, and at that times was  placed on Coumadin and was receiving physical therapy.  There were no  major complications with this surgery, but the patient states that the  last evening before she started having chest pain that continued to the  next day and came to the emergency room to be evaluated.  Upon being  seen in the emergency room, studies showed a single large perfusion  ventilation mismatch, probable for pulmonary embolus. Secondary to this,  the patient was admitted to the hospital and was placed on Lovenox twice  a day and started on Coumadin.  The patient's breathing and shortness of  breath and chest pain have slowly improved.  The patient's PT and INR  have continued to be monitored on a daily basis. Coumadin has been  adjusted.  The patient has been on IV antiemetics, IV pain medication.   Pulmonology was consulted secondary to the patient's pulmonary embolus.  The patient had repeat chest pain on February 04, 2008. A repeat  perfusion scan  was performed which showed no changes in her pulmonary  embolus.  The patient did have a chest x-ray done on January 30, 2008,  that showed no acute findings.  At this time, we feel the patient could  be sent home with home health to do her Lovenox injections and  distribute her Coumadin, and the patient will continue with physical  therapy.   MEDICATIONS ON DISCHARGE:  1. Restoril 15 mg 2 tablets at bedtime.  2. Dilaudid 2 mg 2 tablets p.o. every 4 hours.  3. Lipitor 20 mg daily.  4. Bicarbonate 500 mg 1 tablet p.o. q.6 h.  5. Lovenox 115 mg subcutaneously q.12 h.  6. Coumadin 15 mg one time today, then tomorrow start 10 mg p.o. daily      until INR reaches between 2-3, then Lovenox can be discontinued.   VITAL SIGNS ON DISCHARGE:  Temperature 98.3, pulse 74, respirations 17,  blood pressure 121/64.  She is saturating 94% on room air.   LABORATORY DATA:  PT 80.5, INR 1.5.  White count 5.4, hemoglobin  11.4,  hematocrit 32.9, platelets 343.  Sodium 140, potassium 4.1, chloride  101, CO2 32, glucose 113, BUN 15, creatinine 0.71.   CONDITION ON DISCHARGE:  Stable.   DISPOSITION:  Patient to be discharged to home.   DISCHARGE INSTRUCTIONS:  1. The patient is to maintain a low-sodium, heart-healthy diet.  2. Increase her activity slowly.  3. Follow up with her primary care physician in approximately 1 week.  4. She is to call orthopedic surgeon to have appointment for followup      for her left knee.  5. The patient be sent home with home health.  6. Patient is to schedule or will have case manager schedule her for      physical therapy that she was receiving prior to being admitted to      the hospital.  7. Home health to check her PT and INR while she is on Lovenox and      Coumadin, and the patient could then either go to her primary care      physician for her Coumadin checks or to the Coumadin clinic.      Skeet Latch, DO  Electronically Signed     SM/MEDQ  D:   02/05/2008  T:  02/05/2008  Job:  [id number]

## 2011-04-26 NOTE — Consult Note (Signed)
NAMEMARSENA, [name]            ACCOUNT NO.:  [id number]   MEDICAL RECORD NO.:  [id number]          PATIENT TYPE:  INP   LOCATION:  4733                         FACILITY:  [facility]   PHYSICIAN:  Lyn Records, M.D.   DATE OF BIRTH:  1956-01-16   DATE OF CONSULTATION:  07/12/2009  DATE OF DISCHARGE:                                 CONSULTATION   REASON FOR CONSULTATION:  Chest pain.   CONCLUSIONS:  1. Atypical chest pain, sharp in quality, continuous for 48 hours.      Myocardial infarction has been ruled out by enzymes.  Sharp quality      raises the question of pericarditis.  Pulmonary hypertension would      also be a consideration in this patient with prior history of      pulmonary emboli.  2. History of pulmonary embolism in 2009.  Low probability V/Q scan on      July 12, 2009.  3. Marked obesity.  Rule out obesity hypoventilation syndrome/sleep      apnea.  4. Hypertension.  5. Chronic obstructive pulmonary disease.  6. Gastroesophageal reflux disease.  7. Degenerative disk disease.  8. Multiple reported allergies.   RECOMMENDATIONS:  1. A 2D echocardiogram to assess for pulmonary hypertension and rule      out pericardial effusion.  2. Nuclear myocardial perfusion study with pharmacologic stress to      rule out coronary artery disease.  3. Consider sleep study.  4. Continue aspirin.   COMMENTS:  The patient is 60 and has multiple medical problems.  She  came to the [facility] Emergency Room after having 48 hours of a sharp  lower chest/subxiphoid discomfort that radiated through to her back.  The discomfort is not made worse by walking or positions.  It is made  slightly worse by taking a deep breath.  There is no radiation to the  arms and neck.  There is no associated dyspnea.  It is distinctly  different than the discomfort she had during the pulmonary embolism in  February 2009.  She is comfortable lying flat in her bed today.  The  discomfort is still  present, but with Dilaudid is tolerable.   MEDICATIONS ON ADMISSION:  1. Lipitor 40 mg per day.  2. Benazepril 10 mg per day.  3. Methocarbamol 500 mg twice a day.  4. Diazepam 10 mg every 8 hours.  5. Vicodin 10/325 mg every 6 hours.   HABITS:  The patient is a smoker.  She does not drink alcohol.   FAMILY HISTORY:  Positive for coronary artery disease.  Her father died  of myocardial infarction around 27.  Mother has also had heart problems  and died of heart failure in her mid 23s.  She has a sibling that has a  hole in the heart.   PREVIOUS SURGERIES:  1. Cholecystectomy.  2. Total abdominal hysterectomy and salpingo-oophorectomy.  3. Three right shoulder operations, most recently a complete      replacement.  4. Left knee replacement.  5. Multiple lumbar disk operations.   PHYSICAL EXAMINATION:  GENERAL:  The patient  is obese.  She is in no  distress.  VITAL SIGNS:  Her blood pressure is 135/75, heart rate is 72.  O2  saturation is 97% on room air.  NECK:  The neck veins are not distended.  LUNGS:  Clear.  CARDIAC:  No murmur or gallop.  ABDOMEN:  Obese.  EXTREMITIES:  No edema.   EKG performed on admission on July 11, 2009, is unremarkable and  unchanged from the EKG performed in February 2009.   LABORATORY DATA:  Three sets of cardiac markers normal.  BNP is less  than 30.  Potassium 3.6, creatinine 0.75, BUN is 12.  Chest x-ray is  unremarkable with no active disease.   DISCUSSION:  I do not believe this chest discomfort is ischemic/coronary  disease related.  It could be vascular related in the form of pulmonary  hypertension related pain or pericardial pain.  I guess the aortic pain  is also a possibility.  We will perform an echo.  We will also consider  performing a nuclear myocardial perfusion study.  Perhaps a CT scan of  the chest should be done to rule out the possibility of aortic aneurysm  and/or dissection.      Lyn Records, M.D.   Electronically Signed     HWS/MEDQ  D:  07/12/2009  T:  07/13/2009  Job:  [id number]   cc:   Ralene Ok, M.D.

## 2011-04-26 NOTE — Discharge Summary (Signed)
[name]            ACCOUNT NO.:  [id number]   MEDICAL RECORD NO.:  [id number]           PATIENT TYPE:   LOCATION:                                 FACILITY:   PHYSICIAN:  Ollen Gross, M.D.    DATE OF BIRTH:  1956-06-28   DATE OF ADMISSION:  01/16/2008  DATE OF DISCHARGE:  01/21/2008                               DISCHARGE SUMMARY   ADMITTING DIAGNOSES:  1. Osteoarthritis left knee.  2. History of depression.  3. Asthma.  4. History of bronchitis.  5. History of pneumonia.  6. Chronic obstructive pulmonary disease.  7. Hypertension.  8. Chronic pain, chronic pain clinic in the past.  9. Hypercholesterolemia.  10.Varicose veins  11.Hemorrhoids.  12.Irritable bowel syndrome.  13.Stress urinary incontinence.  14.Osteoporosis.  15.Degenerative disk disease.  16.History of gout.   DISCHARGE DIAGNOSES:  1. Osteoarthritis left knee status post left total knee replacement      arthroplasty.  2. Mild postop hyponatremia, improved.  3. Urinary tract infection at discharge, final result culture pending      at time of discharge but treated with IV vancomycin prior to      discharge.  4. History of depression.  5. Asthma.  6. History of bronchitis.  7. History of pneumonia.  8. Chronic obstructive pulmonary disease.  9. Hypertension.  10.Chronic pain, chronic pain clinic in the past.  11.Hypercholesterolemia.  12.Varicose veins  13.Hemorrhoids.  14.Irritable bowel syndrome.  15.Stress urinary incontinence.  16.Osteoporosis.  17.Degenerative disk disease.  18.History of gout.   PROCEDURE:  January 16, 2008, left total knee.  Surgeon was Dr. Lequita Halt,  assistant Alexzandrew L. Perkins, P.A.C.  Anesthesia general.   CONSULTS:  None.   BRIEF HISTORY:  [name] is a 55 year old female with severe left  knee pain and progressive worsening pain and dysfunction, significant  bone-on-bone arthritis.  She has had arthroscopy with Dr. Eulah Pont without  benefit,  had injections and now presents for total knee.   LABORATORY DATA:  Preop CBC showed hemoglobin 12.5, hematocrit 35.9,  white cell count 8.1, serial CBCs were followed.  Postop hemoglobin 11  which went down to 10.  Last H and H 10.1 and 28.5.  PT/PTT preop 12.6  and 30 respectively.  INR 0.9.  Serial pro-times followed.  PT/INR 28.1  and 2.1.  Chem panel on admission all within normal limits.  Serial  BMETs were followed.  Sodium did drop from 145 to 130 and back up to  135.  Preop UA negative.  Followup UA on February 7 small hemoglobin,  trace leukocyte esterase, many epithelial cells, many bacteria, 0-2  white, 0-2 red.  Blood group type A negative.  Blood cultures x2 no  growth after 5 days.  Body fluid culture synovium gram smear showed no  organisms, no growth.  Urine culture which was pending at the time of  discharge did prove to be positive for E. coli and Klebsiella  pneumoniae.   HOSPITAL COURSE:  The patient was admitted to [facility],  tolerated the procedure well, later transferred to the recovery room and  orthopedic floor, started  on PCA and p.o. analgesic pain control  following surgery, moderate pain on the morning of day 1, encouraged  p.m. meds.  Started to get up with therapy, resumed her home  medications, encouraged pulmonary toilet.  By day 2 she was doing much  better.  Discontinued the PCA.  Had excellent urinary output.  Hemoglobin was 10.  The patient was asymptomatic with a mild anemia.  Started to get up with more therapy on day 2.  Walked about 15 feet on  day 1, got up a little bit more on day 2, dressing change, incision  looked good.  Started to progress well with physical therapy.  Had a low-  grade temperature therefore the covering services of the weekend, she  got chest x-ray, blood cultures and UA.  Chest x-ray proved to be  negative.  Blood cultures were negative x2 and the urinalysis was  borderline with only positive leukocyte  esterase as well as 0-2 white  and 0-2 red cells.  The knee had a little bit of increased redness  though.  Therefore underwent an aspiration at the bedside per Dr. Shon Baton  who was covering for Dr. Lequita Halt.  Aspiration did prove to be negative  on the Gram stain smear, no organisms, no growth from the culture.  Also  followed the UA.  By postop day 4 the knee aspirate was checked and was  okay, still had a fair amount of erythema on the inner side.  When the  patient had elevated temperature they started her on Bactrim in the  hospital which was started on January 19, 2008, but discontinued on  January 20, 2008, and started on IV vancomycin for coverage.  The  vancomycin was followed per pharmacy, given 1250 IV q.12 h.  By the  following day on February 9 she did have still a fair amount of pain  with the knee.  She had had an ultrasound ordered that evening prior due  to some lower extremity pain in the calf.  The ultrasound that next  morning did prove to be negative.  She had been receiving 24 hours of IV  vancomycin.  They repeated the chest x-ray which we followed up which  showed no active disease.  The knee was stable.  From a therapy  standpoint she was getting up and walking about 40 feet the day before  and then greater than 50 feet.  She was checked back on later that  afternoon.  She was doing better, seen by Dr. Lequita Halt, felt good and had  received over 24 hours of IV vancomycin, tolerating her therapy,  progressing and decided to be discharged home at that time.   DISCHARGE PLAN:  1. The patient was discharged home on January 21, 2008.  2. Discharge diagnosis please see above.  3. Discharge meds were Dilaudid, Robaxin, Coumadin, Restoril.  4. Activity, weightbearing as tolerated left knee, total knee      protocol.  5. Follow up in 2 weeks.   DISPOSITION:  Home.   CONDITION ON DISCHARGE:  Improving.      Alexzandrew L. Perkins, P.A.C.      Ollen Gross,  M.D.  Electronically Signed    ALP/MEDQ  D:  02/27/2008  T:  02/27/2008  Job:  [id number]   cc:   Ralene Ok, M.D.  Fax: [phone]   Ollen Gross, M.D.  Fax: [phone]

## 2011-04-26 NOTE — Discharge Summary (Signed)
NAMETIMERA, WINDT            ACCOUNT NO.:  [id number]   MEDICAL RECORD NO.:  [id number]          PATIENT TYPE:  INP   LOCATION:  3040                         FACILITY:  [facility]   PHYSICIAN:  Almedia Balls. Ranell Patrick, M.D. DATE OF BIRTH:  1956/05/26   DATE OF ADMISSION:  02/19/2009  DATE OF DISCHARGE:  02/20/2009                               DISCHARGE SUMMARY   ADMISSION DIAGNOSIS:  Right shoulder end-stage osteoarthritis.   DISCHARGE DIAGNOSIS:  Right shoulder end-stage osteoarthritis status  post hemiarthroplasty.   BRIEF HISTORY:  The patient is a 55 year old female with worsening right  shoulder pain secondary to osteoarthritis.  The patient has elected to  have a hemiarthroplasty by Dr. Malon Kindle.   PROCEDURE:  The patient had a right shoulder hemiarthroplasty using the  DePuy system by Dr. Malon Kindle on February 19, 2009.  Assistant was  [employer], PA-C.  General anesthesia was used.  No complications.   HOSPITAL COURSE:  The patient was admitted on February 19, 2009 for the  above-stated procedure which she tolerated well.  After adequate time in  Postanesthesia Care Unit, she was transferred up to 3000.  Postop day  #1, the patient states she was feeling a lot better, very minimal  soreness to the right shoulder, denied any other symptoms, denies any  numbness, tingling, or paresthesias, was able to work with physical  therapy and thus sent home on postop day #1.  Her labs were within  acceptable limits.   ALLERGIES:  The patient has allergies to:  1. LIDOCAINE.  2. LATEX.  3. PENICILLIN.  4. MORPHINE.  5. OXYCODONE.  6. METHADONE.  7. AMITRIPTYLINE.  8. KEFLEX.  9. WELLBUTRIN.  10.CODEINE.   DISCHARGE MEDICATIONS:  1. Lotensin 10 mg p.o. at bedtime.  2. Celebrex 200 mg p.o. daily.  3. Dilaudid 2 mg 1-2 tablets q.4-6 h. p.r.n. pain.  4. Protonix 40 mg p.o. daily.  5. Crestor 20 mg p.o. daily.  6. Ventolin inhaler 1-2 puffs inhaled q.i.d. p.r.n.  7. Robaxin  500 mg p.o. q.6 h.   FOLLOWUP:  The patient is to follow back with Dr. Malon Kindle in 2  weeks.   CONDITION ON DISCHARGE:  Stable.   DIET:  Regular.      Thomas B. Durwin Nora, P.A.      Almedia Balls. Ranell Patrick, M.D.  Electronically Signed    TBD/MEDQ  D:  02/20/2009  T:  02/20/2009  Job:  [id number]

## 2011-04-29 NOTE — H&P (Signed)
[name], [name]            ACCOUNT NO.:  [id number]   MEDICAL RECORD NO.:  [id number]           PATIENT TYPE:   LOCATION:                                 FACILITY:   PHYSICIAN:  Maisie Fus B. Dixon, P.A.  DATE OF BIRTH:  1956/10/01   DATE OF ADMISSION:  DATE OF DISCHARGE:                              HISTORY & PHYSICAL   CHIEF COMPLAINT:  Right shoulder pain.   HISTORY OF PRESENT ILLNESS:  The patient is a 55 year old female, who is  right-hand dominant, complaining about worsening of right shoulder pain.  The patient elected to have a right total shoulder arthroplasty by Dr.  Malon Kindle.   PAST MEDICAL HISTORY:  1. Asthma.  2. Reflux.  3. Hyperlipidemia.   CURRENT MEDICATIONS:  Vicodin, clindamycin, albuterol, Robaxin, Aleve,  omeprazole, Lipitor, and aspirin.   ALLERGIES:  PENICILLIN, CODEINE, OXYCONTIN, MORPHINE, FLEXERIL, ELAVIL,  CELEBREX, LATEX, LIDOCAINE, BEES, KEFLEX, WELLBUTRIN, NEURONTIN, and  GLUTENS.   SOCIAL HISTORY:  The patient does smoke, occasional alcohol use.  She is  retired and sees Dr. Ralene Ok for primary care.   PHYSICAL EXAMINATION:  VITAL SIGNS:  Blood pressure 160/100, pulse 80,  and respirations 18.  GENERAL:  The patient healthy-appearing 55 year old female, in no acute  distress.  Pleasant mood and affect, alert and oriented X3.  CERVICAL SPINE:  Full range of motion without difficulty.  Cranial  nerves II through XII are grossly intact.  CHEST:  Active breath sounds bilaterally.  She does have an occasional  rhonchi in the bases of bilateral lungs.  HEART:  Regular rate and rhythm.  No murmur.  ABDOMEN:  Nontender and nondistended with active bowel sounds.  EXTREMITIES:  Moderate tenderness of the right shoulder with range of  motion and activities with some moderate crepitus.  Capillary refill  less than 2 seconds.  NEUROVASCULAR:  She is intact distally.   Radiographs were reviewed showing end-stage osteoarthritis of the  right  shoulder.   IMPRESSION:  End-stage osteoarthritis, right shoulder.   PLAN OF ACTION:  Right total shoulder arthroplasty by Dr. Malon Kindle.      Thomas B. Ferne Coe.     TBD/MEDQ  D:  01/27/2009  T:  01/28/2009  Job:  [id number]

## 2011-09-01 LAB — URINALYSIS, ROUTINE W REFLEX MICROSCOPIC
Ketones, ur: NEGATIVE
Protein, ur: NEGATIVE
Urobilinogen, UA: 0.2

## 2011-09-01 LAB — COMPREHENSIVE METABOLIC PANEL
BUN: 15
CO2: 29
Calcium: 9.3
Chloride: 106
Creatinine, Ser: 0.72
GFR calc non Af Amer: >60
Total Bilirubin: 0.6

## 2011-09-01 LAB — CBC
HCT: 35.9 — ABNORMAL LOW
MCHC: 34.8
MCV: 87.1
RBC: 4.12
WBC: 8.1

## 2011-09-01 LAB — PROTIME-INR
INR: 0.9
Prothrombin Time: 12.6

## 2011-09-01 LAB — APTT: aPTT: 30

## 2011-09-02 LAB — DIFFERENTIAL
Basophils Absolute: 0
Basophils Absolute: 0
Basophils Absolute: 0
Basophils Absolute: 0
Basophils Relative: 0
Basophils Relative: 0
Basophils Relative: 1
Eosinophils Absolute: 0.1
Eosinophils Absolute: 0.1
Eosinophils Absolute: 0.1
Eosinophils Absolute: 0.1
Eosinophils Relative: 2
Eosinophils Relative: 2
Eosinophils Relative: 3
Lymphocytes Relative: 27
Lymphocytes Relative: 37
Lymphs Abs: 2.1
Lymphs Abs: 2.3
Monocytes Absolute: 0.6
Monocytes Absolute: 0.6
Monocytes Relative: 10
Monocytes Relative: 9
Neutro Abs: 3.1
Neutro Abs: 3.2
Neutrophils Relative %: 50
Neutrophils Relative %: 64
Neutrophils Relative %: 66

## 2011-09-02 LAB — MAGNESIUM
Magnesium: 2.1
Magnesium: 2.2

## 2011-09-02 LAB — PROTIME-INR
INR: 1
INR: 2.1 — ABNORMAL HIGH
INR: 1.3
INR: 1.6 — ABNORMAL HIGH
INR: 1.9 — ABNORMAL HIGH
Prothrombin Time: 13.6
Prothrombin Time: 17.6 — ABNORMAL HIGH
Prothrombin Time: 16.5 — ABNORMAL HIGH
Prothrombin Time: 16.8 — ABNORMAL HIGH
Prothrombin Time: 18.6 — ABNORMAL HIGH
Prothrombin Time: 22.6 — ABNORMAL HIGH

## 2011-09-02 LAB — BASIC METABOLIC PANEL
BUN: 12
BUN: 7
BUN: 13
BUN: 15
CO2: 27
CO2: 32
CO2: 32
Calcium: 8.3 — ABNORMAL LOW
Calcium: 8.9
Calcium: 9
Calcium: 9.1
Calcium: 9.2
Chloride: 98
Chloride: 103
Creatinine, Ser: 0.62
Creatinine, Ser: 0.7
GFR calc Af Amer: >60
GFR calc non Af Amer: >60
GFR calc non Af Amer: >60
Glucose, Bld: 117 — ABNORMAL HIGH
Glucose, Bld: 110 — ABNORMAL HIGH
Glucose, Bld: 111 — ABNORMAL HIGH
Glucose, Bld: 113 — ABNORMAL HIGH
Glucose, Bld: 92
Potassium: 4.1
Potassium: 3.5
Sodium: 136
Sodium: 137
Sodium: 140
Sodium: 141

## 2011-09-02 LAB — CBC
HCT: 28.6 — ABNORMAL LOW
HCT: 31.2 — ABNORMAL LOW
HCT: 32.9 — ABNORMAL LOW
HCT: 34.1 — ABNORMAL LOW
Hemoglobin: 10.8 — ABNORMAL LOW
Hemoglobin: 11.4 — ABNORMAL LOW
Hemoglobin: 11.8 — ABNORMAL LOW
MCHC: 35
MCHC: 35.3
MCHC: 35.3
MCHC: 34.6
MCHC: 34.7
MCHC: 34.8
MCHC: 35.7
MCV: 86.2
MCV: 86.8
MCV: 87.7
MCV: 87.3
MCV: 87.7
MCV: 88.1
MCV: 88.3
Platelets: 201
Platelets: 203
Platelets: 238
Platelets: 343
Platelets: 356
Platelets: 394
Platelets: 410 — ABNORMAL HIGH
RBC: 3.6 — ABNORMAL LOW
RBC: 3.54 — ABNORMAL LOW
RBC: 3.85 — ABNORMAL LOW
RBC: 3.91
RDW: 12.7
RDW: 12.7
RDW: 13.2
RDW: 13.2
RDW: 13.5
WBC: 9.1
WBC: 6.2
WBC: 7
WBC: 7.3

## 2011-09-02 LAB — PHOSPHORUS: Phosphorus: 5.1 — ABNORMAL HIGH

## 2011-09-02 LAB — BODY FLUID CULTURE: Culture: NO GROWTH

## 2011-09-02 LAB — URINALYSIS, ROUTINE W REFLEX MICROSCOPIC
Bilirubin Urine: NEGATIVE
Ketones, ur: NEGATIVE
Protein, ur: NEGATIVE
Specific Gravity, Urine: 1.014
Urobilinogen, UA: 1

## 2011-09-02 LAB — CULTURE, BLOOD (ROUTINE X 2)

## 2011-09-02 LAB — URINE MICROSCOPIC-ADD ON

## 2011-09-02 LAB — GLUCOSE, SEROUS FLUID: Glucose, Fluid: 72

## 2011-09-02 LAB — POCT CARDIAC MARKERS
Myoglobin, poc: 63.5
Operator id: 221061

## 2011-09-02 LAB — PROTEIN, BODY FLUID: Total protein, fluid: 4.5

## 2011-09-02 LAB — URINE CULTURE

## 2011-09-02 LAB — CULTURE, ROUTINE-ABSCESS: Gram Stain: NONE SEEN

## 2011-09-02 LAB — APTT: aPTT: 37

## 2011-09-02 LAB — COMPREHENSIVE METABOLIC PANEL
CO2: 29
Calcium: 9.1
Creatinine, Ser: 0.67
GFR calc non Af Amer: >60
Glucose, Bld: 106 — ABNORMAL HIGH

## 2011-09-22 LAB — CBC
Hemoglobin: 12.2
MCHC: 34.7
Platelets: 238
RBC: 3.88
RBC: 4.34
WBC: 18.3 — ABNORMAL HIGH
WBC: 9

## 2011-09-22 LAB — BASIC METABOLIC PANEL
Chloride: 103
Creatinine, Ser: 0.68
GFR calc Af Amer: >60
Potassium: 4
Sodium: 138

## 2011-09-22 LAB — COMPREHENSIVE METABOLIC PANEL
ALT: 19
AST: 26
Alkaline Phosphatase: 61
CO2: 25
Calcium: 9.1
GFR calc Af Amer: >60
GFR calc non Af Amer: >60
Glucose, Bld: 229 — ABNORMAL HIGH
Potassium: 3.6
Sodium: 138

## 2011-09-22 LAB — BLOOD GAS, ARTERIAL
Bicarbonate: 24.2 — ABNORMAL HIGH
FIO2: 0.21
O2 Saturation: 94
TCO2: 21.3
pCO2 arterial: 33.6 — ABNORMAL LOW
pCO2 arterial: 37.8
pH, Arterial: 7.406 — ABNORMAL HIGH
pH, Arterial: 7.47 — ABNORMAL HIGH
pO2, Arterial: 66.3 — ABNORMAL LOW
pO2, Arterial: 72.1 — ABNORMAL LOW

## 2011-09-22 LAB — URINALYSIS, ROUTINE W REFLEX MICROSCOPIC
Glucose, UA: NEGATIVE
Leukocytes, UA: NEGATIVE
Protein, ur: NEGATIVE
Specific Gravity, Urine: 1.01
Urobilinogen, UA: 0.2

## 2011-09-22 LAB — CULTURE, RESPIRATORY W GRAM STAIN: Culture: NORMAL

## 2011-09-22 LAB — DIFFERENTIAL
Basophils Relative: 0
Basophils Relative: 0
Eosinophils Absolute: 0
Eosinophils Relative: 0
Lymphs Abs: 1
Lymphs Abs: 1.1
Monocytes Relative: 2 — ABNORMAL LOW
Monocytes Relative: 6
Neutro Abs: 7.2
Neutrophils Relative %: 80 — ABNORMAL HIGH

## 2011-09-22 LAB — URINE CULTURE: Culture: NO GROWTH

## 2011-09-22 LAB — GRAM STAIN

## 2011-09-22 LAB — URINE MICROSCOPIC-ADD ON

## 2011-09-22 LAB — EXPECTORATED SPUTUM ASSESSMENT W GRAM STAIN, RFLX TO RESP C

## 2011-10-04 ENCOUNTER — Other Ambulatory Visit (HOSPITAL_COMMUNITY): Payer: Self-pay | Admitting: Physical Medicine and Rehabilitation

## 2011-10-04 DIAGNOSIS — M5137 Other intervertebral disc degeneration, lumbosacral region: Secondary | ICD-10-CM

## 2011-10-06 ENCOUNTER — Other Ambulatory Visit: Payer: Self-pay | Admitting: Physical Medicine and Rehabilitation

## 2011-10-06 DIAGNOSIS — M545 Low back pain, unspecified: Secondary | ICD-10-CM

## 2011-10-07 ENCOUNTER — Ambulatory Visit (HOSPITAL_COMMUNITY): Payer: Medicare Other

## 2011-10-10 ENCOUNTER — Ambulatory Visit
Admission: RE | Admit: 2011-10-10 | Discharge: 2011-10-10 | Disposition: A | Discharge status: Home / Self Care | Payer: Medicare Other | Source: Ambulatory Visit | Attending: Physical Medicine and Rehabilitation | Admitting: Physical Medicine and Rehabilitation

## 2011-10-10 DIAGNOSIS — M545 Low back pain: Secondary | ICD-10-CM

## 2011-12-05 ENCOUNTER — Inpatient Hospital Stay (HOSPITAL_COMMUNITY)
Admission: EM | Admit: 2011-12-05 | Discharge: 2011-12-07 | DRG: 190 | Disposition: A | Discharge status: Home / Self Care | Payer: Medicare Other | Attending: Internal Medicine | Admitting: Internal Medicine

## 2011-12-05 ENCOUNTER — Emergency Department (HOSPITAL_COMMUNITY): Payer: Medicare Other

## 2011-12-05 ENCOUNTER — Encounter: Payer: Self-pay | Admitting: *Deleted

## 2011-12-05 DIAGNOSIS — E669 Obesity, unspecified: Secondary | ICD-10-CM | POA: Diagnosis present

## 2011-12-05 DIAGNOSIS — M7989 Other specified soft tissue disorders: Secondary | ICD-10-CM | POA: Diagnosis present

## 2011-12-05 DIAGNOSIS — J441 Chronic obstructive pulmonary disease with (acute) exacerbation: Principal | ICD-10-CM | POA: Diagnosis present

## 2011-12-05 DIAGNOSIS — M549 Dorsalgia, unspecified: Secondary | ICD-10-CM | POA: Diagnosis present

## 2011-12-05 DIAGNOSIS — I10 Essential (primary) hypertension: Secondary | ICD-10-CM

## 2011-12-05 DIAGNOSIS — F17200 Nicotine dependence, unspecified, uncomplicated: Secondary | ICD-10-CM | POA: Diagnosis present

## 2011-12-05 DIAGNOSIS — Z720 Tobacco use: Secondary | ICD-10-CM | POA: Diagnosis present

## 2011-12-05 DIAGNOSIS — J189 Pneumonia, unspecified organism: Secondary | ICD-10-CM | POA: Diagnosis present

## 2011-12-05 LAB — CBC
MCH: 29.4 pg (ref 26.0–34.0)
MCV: 89.9 fL (ref 78.0–100.0)
Platelets: 190 10*3/uL (ref 150–400)
RDW: 13.9 % (ref 11.5–15.5)
WBC: 9.5 10*3/uL (ref 4.0–10.5)

## 2011-12-05 LAB — DIFFERENTIAL
Basophils Absolute: 0 10*3/uL (ref 0.0–0.1)
Basophils Relative: 0 % (ref 0–1)
Eosinophils Absolute: 0 10*3/uL (ref 0.0–0.7)
Eosinophils Relative: 0 % (ref 0–5)
Neutrophils Relative %: 85 % — ABNORMAL HIGH (ref 43–77)

## 2011-12-05 LAB — INFLUENZA PANEL BY PCR (TYPE A & B): Influenza A By PCR: NEGATIVE

## 2011-12-05 LAB — BASIC METABOLIC PANEL
Calcium: 8.9 mg/dL (ref 8.4–10.5)
GFR calc Af Amer: >90 mL/min (ref >90)
GFR calc non Af Amer: >90 mL/min (ref >90)
Sodium: 134 mEq/L — ABNORMAL LOW (ref 135–145)

## 2011-12-05 MED ORDER — SODIUM CHLORIDE 0.9 % IJ SOLN
INTRAMUSCULAR | Status: AC
  Administered 2011-12-05: 3 mL
  Filled 2011-12-05: qty 3

## 2011-12-05 MED ORDER — SENNA 8.6 MG PO TABS
1.0000 | ORAL_TABLET | Freq: Two times a day (BID) | ORAL | Status: DC
  Administered 2011-12-05 – 2011-12-06 (×3): 8.6 mg via ORAL
  Filled 2011-12-05 (×4): qty 1

## 2011-12-05 MED ORDER — METHOCARBAMOL 500 MG PO TABS: 500.0000 mg | ORAL_TABLET | Freq: Four times a day (QID) | ORAL | Status: DC | PRN

## 2011-12-05 MED ORDER — ALBUTEROL (5 MG/ML) CONTINUOUS INHALATION SOLN
10.0000 mg/h | INHALATION_SOLUTION | Freq: Once | RESPIRATORY_TRACT | Status: AC
  Administered 2011-12-05: 10 mg/h via RESPIRATORY_TRACT
  Filled 2011-12-05: qty 20

## 2011-12-05 MED ORDER — ENOXAPARIN SODIUM 60 MG/0.6ML ~~LOC~~ SOLN
60.0000 mg | Freq: Every day | SUBCUTANEOUS | Status: DC
Start: 2011-12-05 — End: 2011-12-07
  Administered 2011-12-05 – 2011-12-07 (×3): 60 mg via SUBCUTANEOUS
  Filled 2011-12-05 (×3): qty 0.6

## 2011-12-05 MED ORDER — BIOTENE DRY MOUTH MT LIQD
15.0000 mL | Freq: Two times a day (BID) | OROMUCOSAL | Status: DC
  Administered 2011-12-05 – 2011-12-07 (×5): 15 mL via OROMUCOSAL

## 2011-12-05 MED ORDER — ALBUTEROL SULFATE (5 MG/ML) 0.5% IN NEBU
5.0000 mg | INHALATION_SOLUTION | RESPIRATORY_TRACT | Status: DC
  Administered 2011-12-05: 5 mg via RESPIRATORY_TRACT
  Filled 2011-12-05 (×2): qty 0.5

## 2011-12-05 MED ORDER — ALUM & MAG HYDROXIDE-SIMETH 200-200-20 MG/5ML PO SUSP: 30.0000 mL | Freq: Four times a day (QID) | ORAL | Status: DC | PRN

## 2011-12-05 MED ORDER — ONDANSETRON HCL 4 MG PO TABS: 4.0000 mg | ORAL_TABLET | Freq: Four times a day (QID) | ORAL | Status: DC | PRN

## 2011-12-05 MED ORDER — IPRATROPIUM BROMIDE 0.02 % IN SOLN
0.5000 mg | Freq: Once | RESPIRATORY_TRACT | Status: AC
  Administered 2011-12-05: 0.5 mg via RESPIRATORY_TRACT
  Filled 2011-12-05: qty 2.5

## 2011-12-05 MED ORDER — IPRATROPIUM BROMIDE 0.02 % IN SOLN
0.5000 mg | Freq: Four times a day (QID) | RESPIRATORY_TRACT | Status: DC
  Administered 2011-12-05 – 2011-12-07 (×8): 0.5 mg via RESPIRATORY_TRACT
  Filled 2011-12-05 (×10): qty 2.5

## 2011-12-05 MED ORDER — POTASSIUM CHLORIDE IN NACL 20-0.9 MEQ/L-% IV SOLN
INTRAVENOUS | Status: DC
  Administered 2011-12-05 – 2011-12-07 (×2): via INTRAVENOUS

## 2011-12-05 MED ORDER — SODIUM CHLORIDE 0.9 % IV SOLN
INTRAVENOUS | Status: AC
  Administered 2011-12-05: 11:00:00 via INTRAVENOUS

## 2011-12-05 MED ORDER — DOCUSATE SODIUM 100 MG PO CAPS
100.0000 mg | ORAL_CAPSULE | Freq: Two times a day (BID) | ORAL | Status: DC
  Administered 2011-12-05 – 2011-12-06 (×3): 100 mg via ORAL
  Filled 2011-12-05 (×4): qty 1

## 2011-12-05 MED ORDER — ALBUTEROL SULFATE (5 MG/ML) 0.5% IN NEBU
2.5000 mg | INHALATION_SOLUTION | Freq: Four times a day (QID) | RESPIRATORY_TRACT | Status: DC
  Administered 2011-12-05 – 2011-12-06 (×3): 2.5 mg via RESPIRATORY_TRACT
  Filled 2011-12-05 (×3): qty 0.5

## 2011-12-05 MED ORDER — MOXIFLOXACIN HCL 400 MG PO TABS
400.0000 mg | ORAL_TABLET | Freq: Every day | ORAL | Status: DC
  Administered 2011-12-05 – 2011-12-06 (×2): 400 mg via ORAL
  Filled 2011-12-05 (×2): qty 1

## 2011-12-05 MED ORDER — ALBUTEROL SULFATE (5 MG/ML) 0.5% IN NEBU
INHALATION_SOLUTION | RESPIRATORY_TRACT | Status: AC
  Administered 2011-12-05: 5 mg
  Filled 2011-12-05: qty 1

## 2011-12-05 MED ORDER — DEXAMETHASONE SODIUM PHOSPHATE 4 MG/ML IJ SOLN
10.0000 mg | Freq: Once | INTRAMUSCULAR | Status: AC
  Administered 2011-12-05: 10 mg via INTRAMUSCULAR
  Filled 2011-12-05: qty 3

## 2011-12-05 MED ORDER — ZOLPIDEM TARTRATE 5 MG PO TABS
5.0000 mg | ORAL_TABLET | Freq: Every evening | ORAL | Status: DC | PRN
  Administered 2011-12-05: 5 mg via ORAL
  Filled 2011-12-05: qty 1

## 2011-12-05 MED ORDER — IPRATROPIUM BROMIDE 0.02 % IN SOLN
RESPIRATORY_TRACT | Status: AC
  Administered 2011-12-05: 0.5 mg
  Filled 2011-12-05: qty 2.5

## 2011-12-05 MED ORDER — ALBUTEROL SULFATE (5 MG/ML) 0.5% IN NEBU
2.5000 mg | INHALATION_SOLUTION | RESPIRATORY_TRACT | Status: DC | PRN
  Administered 2011-12-05 – 2011-12-06 (×2): 2.5 mg via RESPIRATORY_TRACT
  Filled 2011-12-05 (×2): qty 0.5

## 2011-12-05 MED ORDER — HYDROCODONE-ACETAMINOPHEN 10-325 MG PO TABS
1.0000 | ORAL_TABLET | Freq: Four times a day (QID) | ORAL | Status: DC | PRN
  Administered 2011-12-05 – 2011-12-06 (×3): 1 via ORAL
  Filled 2011-12-05 (×3): qty 1

## 2011-12-05 MED ORDER — ONDANSETRON HCL 4 MG/2ML IJ SOLN: 4.0000 mg | Freq: Four times a day (QID) | INTRAMUSCULAR | Status: DC | PRN

## 2011-12-05 MED ORDER — BENAZEPRIL HCL 10 MG PO TABS
10.0000 mg | ORAL_TABLET | Freq: Every day | ORAL | Status: DC
  Administered 2011-12-05 – 2011-12-07 (×3): 10 mg via ORAL
  Filled 2011-12-05 (×3): qty 1

## 2011-12-05 MED ORDER — MOXIFLOXACIN HCL IN NACL 400 MG/250ML IV SOLN
400.0000 mg | Freq: Once | INTRAVENOUS | Status: AC
  Administered 2011-12-05: 400 mg via INTRAVENOUS
  Filled 2011-12-05: qty 250

## 2011-12-05 MED ORDER — ACETAMINOPHEN 325 MG PO TABS: 650.0000 mg | ORAL_TABLET | Freq: Four times a day (QID) | ORAL | Status: DC | PRN

## 2011-12-05 MED ORDER — PREDNISONE 20 MG PO TABS
60.0000 mg | ORAL_TABLET | Freq: Two times a day (BID) | ORAL | Status: DC
  Administered 2011-12-05 – 2011-12-07 (×4): 60 mg via ORAL
  Filled 2011-12-05 (×4): qty 3

## 2011-12-05 MED ORDER — PANTOPRAZOLE SODIUM 40 MG PO TBEC
40.0000 mg | DELAYED_RELEASE_TABLET | Freq: Every day | ORAL | Status: DC
  Administered 2011-12-05 – 2011-12-07 (×3): 40 mg via ORAL
  Filled 2011-12-05 (×3): qty 1

## 2011-12-05 MED ORDER — SODIUM CHLORIDE 0.9 % IV SOLN
INTRAVENOUS | Status: DC
  Administered 2011-12-05: 06:00:00 via INTRAVENOUS

## 2011-12-05 MED ORDER — ACETAMINOPHEN 650 MG RE SUPP: 650.0000 mg | Freq: Four times a day (QID) | RECTAL | Status: DC | PRN

## 2011-12-05 MED ORDER — ASPIRIN 325 MG PO TABS
325.0000 mg | ORAL_TABLET | Freq: Every day | ORAL | Status: DC
Start: 2011-12-05 — End: 2011-12-07
  Administered 2011-12-05 – 2011-12-07 (×3): 325 mg via ORAL
  Filled 2011-12-05 (×3): qty 1

## 2011-12-05 MED ORDER — KETOROLAC TROMETHAMINE 30 MG/ML IJ SOLN
30.0000 mg | Freq: Once | INTRAMUSCULAR | Status: AC
  Administered 2011-12-05: 30 mg via INTRAVENOUS
  Filled 2011-12-05: qty 1

## 2011-12-05 NOTE — ED Provider Notes (Signed)
History     CSN: [id number]  Arrival date & time 12/05/11  0139   First MD Initiated Contact with Patient 12/05/11 0235      Chief Complaint  Patient presents with  . Shortness of Breath  . Cough    (Consider location/radiation/quality/duration/timing/severity/associated sxs/prior treatment) HPI Comments: The patient is a 55 year old female who presents for evaluation of shortness of breath, dyspnea, and cough, dry nonproductive. She reports worsening dyspnea over the last 2 days with wheezing and onset of nonproductive cough yesterday. She denies any fever or chest pain. She has a history of COPD and asthma. On presentation she is tachycardic and tachypnea with mild hypoxia on room air, requiring 3 L of oxygen by nasal cannula to maintain her oxygen saturations. The patient is not on home oxygen. She appears to be in moderate respiratory distress with accessory muscle usage and prolonged exhalatory phase. On auscultation of her lungs, she has bilateral exhalatory wheezes but no rhonchi or rales.  Patient is a 55 y.o. female presenting with shortness of breath and cough. The history is provided by the patient.  Shortness of Breath  The current episode started 2 days ago. The onset was gradual. The problem occurs continuously. The problem has been gradually worsening. The problem is moderate. The symptoms are relieved by nothing. The symptoms are aggravated by activity. Associated symptoms include cough, shortness of breath and wheezing. Pertinent negatives include no chest pain, no chest pressure, no orthopnea, no fever, no rhinorrhea, no sore throat and no stridor. The cough has no precipitants. The cough is non-productive. There is no color change associated with the cough. Nothing relieves the cough. Nothing worsens the cough. There was no intake of a foreign body. She was not exposed to toxic fumes. She has not inhaled smoke recently. Her past medical history is significant for asthma and  past wheezing. She has been behaving normally. There were no sick contacts.  Cough Associated symptoms include shortness of breath and wheezing. Pertinent negatives include no chest pain, no chills, no ear pain, no headaches, no rhinorrhea, no sore throat, no myalgias and no eye redness. Her past medical history is significant for asthma.    Past Medical History  Diagnosis Date  . COPD (chronic obstructive pulmonary disease)   . Asthma   . Hypertension     Past Surgical History  Procedure Date  . Total knee arthroplasty   . Total shoulder replacement   . Abdominal hysterectomy   . Cholecystectomy   . Back surgery     History reviewed. No pertinent family history.  History  Substance Use Topics  . Smoking status: Current Some Day Smoker  . Smokeless tobacco: Not on file  . Alcohol Use: No    OB History    Grav Para Term Preterm Abortions TAB SAB Ect Mult Living                  Review of Systems  Constitutional: Positive for fatigue. Negative for fever, chills, diaphoresis, activity change, appetite change and unexpected weight change.  HENT: Negative for hearing loss, ear pain, nosebleeds, congestion, sore throat, rhinorrhea, mouth sores, neck pain, neck stiffness, dental problem, postnasal drip and ear discharge.   Eyes: Negative for photophobia, pain, discharge and redness.  Respiratory: Positive for cough, shortness of breath and wheezing. Negative for choking and stridor.   Cardiovascular: Negative for chest pain, palpitations, orthopnea and leg swelling.  Gastrointestinal: Negative for nausea, vomiting, abdominal pain and diarrhea.  Genitourinary: Negative for  dysuria and flank pain.  Musculoskeletal: Negative for myalgias.  Skin: Negative for color change, pallor, rash and wound.  Neurological: Negative for dizziness, syncope, weakness, light-headedness, numbness and headaches.  Hematological: Negative for adenopathy.  Psychiatric/Behavioral: Negative.      Allergies  Bee; Codeine; Elavil; Gluten; Keflex; Latex; Morphine and related; Neurontin; Oxycontin; Penicillins; Versed; Vicodin; Wellbutrin; and Xylocaine  Home Medications   Current Outpatient Rx  Name Route Sig Dispense Refill  . ALBUTEROL IN Inhalation Inhale into the lungs.      . ASPIRIN 325 MG PO TABS Oral Take 325 mg by mouth daily.      Marland Kitchen BENAZEPRIL HCL 10 MG PO TABS Oral Take 10 mg by mouth daily.      Marland Kitchen DEXILANT PO Oral Take by mouth.      Marland Kitchen HYDROCODONE-ACETAMINOPHEN 10-325 MG PO TABS Oral Take 1 tablet by mouth every 6 (six) hours as needed.      . METHOCARBAMOL 500 MG PO TABS Oral Take 500 mg by mouth 4 (four) times daily.      Marland Kitchen ALIGN PO Oral Take by mouth.        BP 121/86  Pulse 120  Temp 99.8 F (37.7 C)  Resp 28  Ht 5\' 10"  (1.778 m)  Wt 260 lb (117.935 kg)  BMI 37.31 kg/m2  SpO2 94%  Physical Exam  Nursing note and vitals reviewed. Constitutional: She is oriented to person, place, and time. She appears well-developed and well-nourished. No distress.  HENT:  Head: Normocephalic and atraumatic.  Right Ear: External ear normal.  Left Ear: External ear normal.  Nose: Nose normal.  Mouth/Throat: Oropharynx is clear and moist.  Eyes: Conjunctivae and EOM are normal. Pupils are equal, round, and reactive to light. Right eye exhibits no discharge. Left eye exhibits no discharge.  Neck: Normal range of motion. Neck supple. No JVD present. No tracheal deviation present.  Cardiovascular: Normal rate, regular rhythm, normal heart sounds and intact distal pulses.  Exam reveals no gallop and no friction rub.   No murmur heard. Pulmonary/Chest: No accessory muscle usage or stridor. Not tachypneic. She is in respiratory distress. She has no decreased breath sounds. She has wheezes in the right upper field, the right middle field, the right lower field, the left upper field, the left middle field and the left lower field. She has no rhonchi. She has no rales. She  exhibits no tenderness.  Abdominal: Soft. Bowel sounds are normal. She exhibits no distension. There is no tenderness. There is no rebound and no guarding.  Musculoskeletal: Normal range of motion. She exhibits no edema and no tenderness.  Lymphadenopathy:    She has no cervical adenopathy.  Neurological: She is alert and oriented to person, place, and time. She has normal reflexes. No cranial nerve deficit. She exhibits normal muscle tone. Coordination normal.  Skin: Skin is warm and dry. No rash noted. She is not diaphoretic. No erythema. No pallor.  Psychiatric: She has a normal mood and affect. Her behavior is normal. Judgment and thought content normal.    ED Course  Procedures (including critical care time)  Labs Reviewed  DIFFERENTIAL - Abnormal; Notable for the following:    Neutrophils Relative 85 (*)    Neutro Abs 8.0 (*)    Lymphocytes Relative 8 (*)    All other components within normal limits  BASIC METABOLIC PANEL - Abnormal; Notable for the following:    Sodium 134 (*)    Glucose, Bld 116 (*)  All other components within normal limits  CBC   Dg Chest 2 View  12/05/2011  *RADIOLOGY REPORT*  Clinical Data: Shortness of breath, weakness, dyspnea and cough; history of asthma.  CHEST - 2 VIEW  Comparison: Chest radiograph performed 08/17/2010  Findings: The lungs are well-aerated.  Mild chronic lung changes are seen.  Mild left basilar opacity may reflect atelectasis. There is no evidence of pleural effusion or pneumothorax.  The heart is normal in size; the mediastinal contour is within normal limits.  No acute osseous abnormalities are seen.  A right shoulder hemiarthroplasty is grossly unremarkable in appearance.  IMPRESSION: Mild left basilar airspace opacity may reflect atelectasis.  Mild chronic lung changes seen.  Original Report Authenticated By: Tonia Ghent, M.D.     No diagnosis found.    MDM  COPD exacerbation, viral upper respiratory tract infection with  cough, influenza, community-acquired pneumonia are all considered among other etiologies in the patient's differential diagnosis. Her chest x-ray shows what could be an early left lower lobe pneumonia and as such the patient has been prescribed moxifloxacin IV. Her breathing at this time is still labored mild to moderately, and she still has expiratory wheezes and dyspnea. She will require admission for further management prior to discharge.        Felisa Bonier, MD 12/05/11 [phone]

## 2011-12-05 NOTE — ED Notes (Signed)
Patient attempted to ambulate to bathroom; however, she became short of breath with labored and shallow breathing. Returned patient to room via wheelchair and retrieved a bedside commode for patient use. Patient stated "I thought I was better but maybe I should not get up and walk again."

## 2011-12-05 NOTE — ED Notes (Signed)
Respiratory at bedside.

## 2011-12-05 NOTE — ED Notes (Signed)
Pt c/o sob and cough. No relief with breathing treatments at home.

## 2011-12-05 NOTE — ED Notes (Signed)
Spoke with charge nurse about bed assignment, said she will do it now.

## 2011-12-05 NOTE — H&P (Signed)
Hospital Admission Note Date: 12/05/2011  Patient name: [name] Medical record number: [id number] Date of birth: 06-01-1956 Age: 55 y.o. Gender: female PCP: Ralene Ok, MD, MD  Attending physician: Christiane Ha  Chief Complaint: Shortness of breath.  History of Present Illness:  [name] is an 55 y.o. female who presents to the emergency room with a several day history of worsening shortness of breath. She has a history of "asthma" and continues to smoke a pack of cigarettes a day. She has had a cough productive of yellowish sputum. She was reportedly hypoxic and tachypneic in the emergency room. She received steroids and bronchodilators without any improvement. Chest x-ray showed left lower lobe atelectasis versus infiltrate. She feels a little better currently. Her husband is sick with bronchitis. She was given a dose of Avelox in the emergency room. Denies chest pain.  Past Medical History  Diagnosis Date  . COPD (chronic obstructive pulmonary disease)   . Asthma   . 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 albuterol (PROVENTIL HFA;VENTOLIN HFA) 108 (90 BASE) MCG/ACT inhaler Inhale 2 puffs into the lungs every 6 (six) hours as needed. Shortness of breath       . aspirin 325 MG tablet Take 325 mg by mouth daily.        . benazepril (LOTENSIN) 10 MG tablet Take 10 mg by mouth daily.        Marland Kitchen dexlansoprazole (DEXILANT) 60 MG capsule Take 60 mg by mouth daily as needed. indigestion       . HYDROcodone-acetaminophen (NORCO) 10-325 MG per tablet Take 1 tablet by mouth every 6 (six) hours as needed. pain      . methocarbamol (ROBAXIN) 500 MG tablet Take 500 mg by mouth 4 (four) times daily.        . Probiotic Product (ALIGN PO) Take by mouth.          Allergies: Bee; Codeine; Elavil; Gluten; Keflex; Latex; Morphine and related; Neurontin; Oxycontin; Penicillins;  Versed; Vicodin; Wellbutrin; and Xylocaine History   Social History  . Marital Status: Widowed    Spouse Name: N/A    Number of Children: N/A  . Years of Education: N/A   Occupational History  . Not on file.   Social History Main Topics  . Smoking status: Current Some Day Smoker -- 1.0 packs/day for 30 years    Types: Cigarettes  . Smokeless tobacco: Current User  . Alcohol Use: No  . Drug Use: No  . Sexually Active:    Other Topics Concern  . Not on file   Social History Narrative  . No narrative on file   History reviewed. No pertinent family history. Past Surgical History  Procedure Date  . Total knee arthroplasty   . Total shoulder replacement   . Abdominal hysterectomy   . Cholecystectomy   . Back surgery     Review of Systems: Left leg swelling started about a week ago. Systems reviewed and as per HPI, otherwise negative.  Physical Exam: Blood pressure 159/73, pulse 85, temperature 97.9 F (36.6 C), temperature source Oral, resp. rate 16, height 5\' 10"  (1.778 m), weight 119.8 kg (264 lb 1.8 oz), SpO2 96.00%. BP 159/73  Pulse 85  Temp(Src) 97.9 F (36.6 C) (Oral)  Resp 16  Ht 5\' 10"  (1.778 m)  Wt 119.8 kg (264 lb 1.8 oz)  BMI 37.90 kg/m2  SpO2  96%  General Appearance:    asleep. Arousable. No respiratory distress. Full sentences.   Head:    Normocephalic, without obvious abnormality, atraumatic  Eyes:    PERRL, conjunctiva/corneas clear, EOM's intact, fundi    benign, both eyes  Ears:    Normal TM's and external ear canals, both ears  Nose:   Nares normal, septum midline, mucosa normal, no drainage    or sinus tenderness  Throat:   Lips, mucosa, and tongue normal; teeth and gums normal  Neck:   Supple, symmetrical, trachea midline, no adenopathy;    thyroid:  no enlargement/tenderness/nodules; no carotid   bruit or JVD  Back:     Symmetric, no curvature, ROM normal, no CVA tenderness  Lungs:    bilateral wheeze. No rhonchi or rales.   Chest Wall:     No tenderness or deformity   Heart:    Regular rate and rhythm, S1 and S2 normal, no murmur, rub   or gallop     Abdomen:     Soft, non-tender, bowel sounds active all four quadrants,    no masses, no organomegaly  Genitalia:   deferred   Rectal:   deferred   Extremities:   Extremities normal, atraumatic, no cyanosis or 1+ pitting edema on the left leg. Circumference of the left leg greater than right leg. No calf tenderness or Homans sign.   Pulses:   2+ and symmetric all extremities  Skin:   Skin color, texture, turgor normal, no rashes or lesions  Lymph nodes:   Cervical, supraclavicular, and axillary nodes normal  Neurologic:   CNII-XII intact, normal strength, sensation and reflexes    throughout    Psychiatric: Normal affect. Calm and cooperative.  Lab results: Basic Metabolic Panel:  Basename 12/05/11 0544  NA 134*  K 3.6  CL 97  CO2 26  GLUCOSE 116*  BUN 11  CREATININE 0.61  CALCIUM 8.9  MG --  PHOS --   Liver Function Tests: No results found for this basename: AST:2,ALT:2,ALKPHOS:2,BILITOT:2,PROT:2,ALBUMIN:2 in the last 72 hours No results found for this basename: LIPASE:2,AMYLASE:2 in the last 72 hours No results found for this basename: AMMONIA:2 in the last 72 hours CBC:  Basename 12/05/11 0544  WBC 9.5  NEUTROABS 8.0*  HGB 13.1  HCT 40.1  MCV 89.9  PLT 190   Influenza PCR negative for A, B and H1N1.  Imaging results:  Dg Chest 2 View  12/05/2011  *RADIOLOGY REPORT*  Clinical Data: Shortness of breath, weakness, dyspnea and cough; history of asthma.  CHEST - 2 VIEW  Comparison: Chest radiograph performed 08/17/2010  Findings: The lungs are well-aerated.  Mild chronic lung changes are seen.  Mild left basilar opacity may reflect atelectasis. There is no evidence of pleural effusion or pneumothorax.  The heart is normal in size; the mediastinal contour is within normal limits.  No acute osseous abnormalities are seen.  A right shoulder hemiarthroplasty is  grossly unremarkable in appearance.  IMPRESSION: Mild left basilar airspace opacity may reflect atelectasis.  Mild chronic lung changes seen.  Original Report Authenticated By: Tonia Ghent, M.D.    Assessment & Plan:  Principal Problem:  *COPD exacerbation Active Problems: Possible CAP (community acquired pneumonia)  Tobacco abuse  Obesity  Leg swelling  Benign hypertension  Patient will get steroids, bronchodilators, continue Avelox for possible pneumonia. She's encouraged to quit smoking. Supportive care. Check Doppler of the leg to rule out DVT. DVT prophylaxis until then.  Gavriella Hearst L 12/05/2011, 4:59 PM

## 2011-12-05 NOTE — ED Notes (Signed)
Pt was requesting to leave ama.  Says lower back hurts and she is tired of being in the ED.  Dr. Fredricka Bonine spoke with pt and convinced her to stay.  Secretary working on bed placement.

## 2011-12-06 ENCOUNTER — Inpatient Hospital Stay (HOSPITAL_COMMUNITY): Payer: Medicare Other

## 2011-12-06 DIAGNOSIS — M549 Dorsalgia, unspecified: Secondary | ICD-10-CM | POA: Diagnosis present

## 2011-12-06 LAB — GLUCOSE, CAPILLARY: Glucose-Capillary: 100 mg/dL — ABNORMAL HIGH (ref 70–99)

## 2011-12-06 MED ORDER — SODIUM CHLORIDE 0.9 % IN NEBU
INHALATION_SOLUTION | RESPIRATORY_TRACT | Status: AC
  Administered 2011-12-06: 3 mL
  Filled 2011-12-06: qty 3

## 2011-12-06 MED ORDER — ALBUTEROL SULFATE (5 MG/ML) 0.5% IN NEBU
2.5000 mg | INHALATION_SOLUTION | RESPIRATORY_TRACT | Status: DC
  Administered 2011-12-06 – 2011-12-07 (×6): 2.5 mg via RESPIRATORY_TRACT
  Filled 2011-12-06 (×7): qty 0.5

## 2011-12-06 MED ORDER — KETOROLAC TROMETHAMINE 30 MG/ML IJ SOLN
30.0000 mg | Freq: Four times a day (QID) | INTRAMUSCULAR | Status: DC | PRN
  Administered 2011-12-06 – 2011-12-07 (×4): 30 mg via INTRAVENOUS
  Filled 2011-12-06 (×4): qty 1

## 2011-12-06 MED ORDER — DM-GUAIFENESIN ER 30-600 MG PO TB12
1.0000 | ORAL_TABLET | Freq: Two times a day (BID) | ORAL | Status: DC
  Administered 2011-12-06 – 2011-12-07 (×3): 1 via ORAL
  Filled 2011-12-06 (×3): qty 1

## 2011-12-06 MED ORDER — ALBUTEROL SULFATE (5 MG/ML) 0.5% IN NEBU
2.5000 mg | INHALATION_SOLUTION | RESPIRATORY_TRACT | Status: DC | PRN
  Administered 2011-12-06: 2.5 mg via RESPIRATORY_TRACT
  Filled 2011-12-06: qty 0.5

## 2011-12-06 MED ORDER — MUSCLE RUB 10-15 % EX CREA
TOPICAL_CREAM | Freq: Two times a day (BID) | CUTANEOUS | Status: DC
  Administered 2011-12-06 – 2011-12-07 (×2): via TOPICAL
  Filled 2011-12-06: qty 85

## 2011-12-06 MED ORDER — TROLAMINE SALICYLATE 10 % EX CREA
TOPICAL_CREAM | Freq: Two times a day (BID) | CUTANEOUS | Status: DC
  Filled 2011-12-06: qty 85

## 2011-12-06 MED ORDER — HYDROCODONE-HOMATROPINE 5-1.5 MG/5ML PO SYRP: 5.0000 mL | ORAL_SOLUTION | Freq: Every day | ORAL | Status: DC

## 2011-12-06 NOTE — Progress Notes (Signed)
Subjective: Coughed all night.  Short of breath.  Back hurts (chronic).  Requestion topical NSAID. Miserable  Objective: Vital signs in last 24 hours: Temp:  [97.4 F (36.3 C)-100.3 F (37.9 C)] 99.2 F (37.3 C) (12/25 0547) Pulse Rate:  [85-102] 97  (12/25 0547) Resp:  [16-24] 22  (12/25 0547) BP: (110-159)/(71-83) 127/74 mmHg (12/25 0547) SpO2:  [94 %-97 %] 95 % (12/25 0648) Weight:  [119.8 kg (264 lb 1.8 oz)-124.648 kg (274 lb 12.8 oz)] 274 lb 12.8 oz (124.648 kg) (12/25 0547) Weight change: 1.865 kg (4 lb 1.8 oz) Last BM Date: 12/04/11  Intake/Output from previous day: 12/24 0701 - 12/25 0700 In: 1312.5 [P.O.:600; I.V.:712.5] Out: -  Intake/Output this shift:    Gen:  Coughing. Short sentences Lungs:  Decreased thoughout. Bilateral wheeze. CV: RRR without MGR Abd:  S,NT,ND Ext no edema  Lab Results:  [facility] 12/05/11 0544  WBC 9.5  HGB 13.1  HCT 40.1  PLT 190   BMET  Basename 12/05/11 0544  NA 134*  K 3.6  CL 97  CO2 26  GLUCOSE 116*  BUN 11  CREATININE 0.61  CALCIUM 8.9    Studies/Results: Dg Chest 2 View  12/05/2011  *RADIOLOGY REPORT*  Clinical Data: Shortness of breath, weakness, dyspnea and cough; history of asthma.  CHEST - 2 VIEW  Comparison: Chest radiograph performed 08/17/2010  Findings: The lungs are well-aerated.  Mild chronic lung changes are seen.  Mild left basilar opacity may reflect atelectasis. There is no evidence of pleural effusion or pneumothorax.  The heart is normal in size; the mediastinal contour is within normal limits.  No acute osseous abnormalities are seen.  A right shoulder hemiarthroplasty is grossly unremarkable in appearance.  IMPRESSION: Mild left basilar airspace opacity may reflect atelectasis.  Mild chronic lung changes seen.  Original Report Authenticated By: Tonia Ghent, M.D.    Assessment/Plan: Principal Problem:  *COPD exacerbation Active Problems:  CAP (community acquired pneumonia)  Tobacco abuse  Obesity  Leg swelling  Benign hypertension  Back pain Multiple drug allergies.  Add mucinex DM.  Change HHN to q4h around the clock.  Follow up doppler results.  Add prn toradol. Heating pad.  Aspercreme.     LOS: 1 day   Nitya Cauthon L 12/06/2011, 9:51 AM

## 2011-12-07 LAB — GLUCOSE, CAPILLARY

## 2011-12-07 MED ORDER — DM-GUAIFENESIN ER 30-600 MG PO TB12: 1.0000 | ORAL_TABLET | Freq: Two times a day (BID) | ORAL | Status: AC

## 2011-12-07 MED ORDER — ACETAMINOPHEN 325 MG PO TABS: 650.0000 mg | ORAL_TABLET | Freq: Four times a day (QID) | ORAL | Status: AC | PRN

## 2011-12-07 MED ORDER — MOXIFLOXACIN HCL 400 MG PO TABS: 400.0000 mg | ORAL_TABLET | Freq: Every day | ORAL | Status: AC

## 2011-12-07 MED ORDER — ALBUTEROL SULFATE (5 MG/ML) 0.5% IN NEBU: 2.5000 mg | INHALATION_SOLUTION | RESPIRATORY_TRACT | Status: DC | PRN

## 2011-12-07 MED ORDER — PREDNISONE (PAK) 10 MG PO TABS: 10.0000 mg | ORAL_TABLET | Freq: Every day | ORAL | Status: AC

## 2011-12-07 NOTE — Progress Notes (Signed)
Pt ambulated 50 ft without O2. Oxygen level checked after. 91% on RA. MD notified.

## 2011-12-07 NOTE — Progress Notes (Signed)
IV removed, site WNL.  Pt given d/c instructions and new prescriptions.  Discussed home care with patient and discussed home medications, patient verbalizes understanding. F/U appointments in place, pt states she will make and keep appointments. Pt is stable at this time. Pt taken to main entrance in wheelchair by staff member.

## 2011-12-07 NOTE — Progress Notes (Signed)
CARE MANAGEMENT NOTE 12/07/2011  Patient:  KWYNN, SCHLOTTER   Account Number:  [id number]  Date Initiated:  12/07/2011  Documentation initiated by:  Anibal Henderson  Subjective/Objective Assessment:   Admitted with PNA/COPD. Pt is from home, is fairly independent and will return home. She feels she need O2, but O2 sat was 91% after walking over 100 ft.     Action/Plan:   Explained rules for O2, sat 88% or below. pt feels that it is good that she does not need O2 and that she is probably just "panicy" with being sick. Discussed that stopping smoking will help and she states she is going to do this!   Anticipated DC Date:  12/07/2011   Anticipated DC Plan:  HOME/SELF CARE      DC Planning Services  CM consult      Choice offered to / List presented to:             Status of service:  Completed, signed off Medicare Important Message given?   (If response is "NO", the following Medicare IM given date fields will be blank) Date Medicare IM given:   Date Additional Medicare IM given:    Discharge Disposition:  HOME/SELF CARE  Per UR Regulation:    Comments:  12/07/11 1300 Geneva Bolden RN States has stopped smoking before and will do it again!

## 2011-12-08 NOTE — Progress Notes (Signed)
Utilization review completed.  

## 2011-12-18 NOTE — Discharge Summary (Signed)
Physician Discharge Summary  Patient ID: [name] MRN: [id number] DOB/AGE: [birth year] 56 y.o.  Admit date: 12/05/2011 Discharge date: 12/18/2011  Discharge Diagnoses:  Principal Problem:  *COPD exacerbation Active Problems:  CAP (community acquired pneumonia)  Tobacco abuse  Obesity  Leg swelling  Benign hypertension  Back pain   Discharge Medication List as of 12/07/2011  1:35 PM    START taking these medications   Details  acetaminophen (TYLENOL) 325 MG tablet Take 2 tablets (650 mg total) by mouth every 6 (six) hours as needed (or Fever >/= 101)., Starting 12/07/2011, Until Sat 12/17/11, No Print    albuterol (PROVENTIL) (5 MG/ML) 0.5% nebulizer solution Take 0.5 mLs (2.5 mg total) by nebulization every 4 (four) hours as needed for wheezing or shortness of breath., Starting 12/07/2011, Until Thu 12/06/12, Print    dextromethorphan-guaiFENesin (MUCINEX DM) 30-600 MG per 12 hr tablet Take 1 tablet by mouth 2 (two) times daily., Starting 12/07/2011, Until Sat 12/17/11, OTC    moxifloxacin (AVELOX) 400 MG tablet Take 1 tablet (400 mg total) by mouth daily at 6 PM., Starting 12/07/2011, Until Sat 12/17/11, Print    predniSONE (STERAPRED UNI-PAK) 10 MG tablet Take 1 tablet (10 mg total) by mouth daily. 4 tablets daily then decrease by 1 tablet every 2 days until off, Starting 12/07/2011, Until Sat 12/17/11, Print      CONTINUE these medications which have NOT CHANGED   Details  albuterol (PROVENTIL HFA;VENTOLIN HFA) 108 (90 BASE) MCG/ACT inhaler Inhale 2 puffs into the lungs every 6 (six) hours as needed. Shortness of breath , Until Discontinued, Historical Med    aspirin 325 MG tablet Take 325 mg by mouth daily.  , Until Discontinued, Historical Med    benazepril (LOTENSIN) 10 MG tablet Take 10 mg by mouth daily.  , Until Discontinued, Historical Med    dexlansoprazole (DEXILANT) 60 MG capsule Take 60 mg by mouth daily as needed. indigestion , Until Discontinued, Historical Med     methocarbamol (ROBAXIN) 500 MG tablet Take 500 mg by mouth 4 (four) times daily.  , Until Discontinued, Historical Med    Probiotic Product (ALIGN PO) Take by mouth.  , Until Discontinued, Historical Med      STOP taking these medications     ALBUTEROL IN      HYDROcodone-acetaminophen (NORCO) 10-325 MG per tablet         Discharge Orders    Future Orders Please Complete By Expires   Diet general      Increase activity slowly      Discharge instructions      Comments:   Quit smoking      Follow-up Information    Follow up with Ralene Ok, MD in 2 weeks.         Disposition: Home or Self Care  Discharged Condition: stable  Consults:  none  Labs:     134       Potassium  3.6       Chloride  97       CO2  26       BUN  11       Creatinine, Ser  0.61       Calcium  8.9       GFR calc non Af Amer  90 mL/min">90       Glucose, Bld  116        CBC    WBC  9.5       RBC  4.46  Hemoglobin  13.1       HCT  40.1       MCV  89.9       MCH  29.4       MCHC  32.7       RDW  13.9       Platelets  190        DIFFERENTIAL    Neutrophils Relative  85       Lymphocytes Relative  8       Monocytes Relative  7       Eosinophils Relative  0       Basophils Relative  0       Neutro Abs  8.0       Lymphs Abs  0.8       Monocytes Absolute  0.7       Eosinophils Absolute  0.0       Basophils Absolute  0.0        DIABETES    Glucose-Capillary     100 132   Glucose, Bld  116        VIRAL TESTS    Influenza A By PCR   NEGATIVE      Influenza B By PCR   NEGATIVE      H1N1 flu by pcr         Diagnostics:  Dg Chest 2 View  12/05/2011  *RADIOLOGY REPORT*  Clinical Data: Shortness of breath, weakness, dyspnea and cough; history of asthma.  CHEST - 2 VIEW  Comparison: Chest radiograph performed 08/17/2010  Findings: The lungs are well-aerated.  Mild chronic lung changes are seen.  Mild left basilar opacity may reflect atelectasis. There is no evidence of  pleural effusion or pneumothorax.  The heart is normal in size; the mediastinal contour is within normal limits.  No acute osseous abnormalities are seen.  A right shoulder hemiarthroplasty is grossly unremarkable in appearance.  IMPRESSION: Mild left basilar airspace opacity may reflect atelectasis.  Mild chronic lung changes seen.  Original Report Authenticated By: Tonia Ghent, M.D.   US Venous Img Lower Unilateral Left  12/06/2011  *RADIOLOGY REPORT*  Clinical Data: Left leg pain, prior DVT, edema  LEFT LOWER EXTREMITY VENOUS DUPLEX ULTRASOUND  Technique:  Gray-scale sonography with graded compression, as well as color Doppler and duplex ultrasound were performed to evaluate the deep venous system of the lower extremity from the level of the common femoral vein through the popliteal and proximal calf veins. Spectral Doppler was utilized to evaluate flow at rest and with distal augmentation maneuvers.  Comparison:  05/28/2008  Findings:  Normal compressibility of the common femoral, superficial femoral, and popliteal veins is demonstrated, as well as the visualized proximal calf veins.  No filling defects to suggest DVT on grayscale or color Doppler imaging.  Doppler waveforms show normal direction of venous flow, normal respiratory phasicity and response to augmentation.  IMPRESSION: No evidence of lower extremity deep vein thrombosis.  Original Report Authenticated By: Judie Petit. Ruel Favors, M.D.    Hospital Course: 56 y.o. Female presented with dyspnea, wheezing, productive cough for several days.  She smokes cigarettes.  She was hypoxic and tachypneic in the ED  She had wheeze on exam.  CXR showed LLL infiltrate.  She was admitted, started on steroids, avelox, oxygen and bronchodilators.  She had edema, but doppler ruled out DVT.  She eventually improved and was requesting discharge.  She is feeling better, ambulating, and has clear lung sounds.  Hypoxia is  resolved.  Total time over 30  minutes.  Discharge Exam:  Blood pressure 154/92, pulse 77, temperature 97.4 F (36.3 C), temperature source Oral, resp. rate 20, height 5\' 10"  (1.778 m), weight 124.648 kg (274 lb 12.8 oz), SpO2 96.00%.  Gen:  More comfortable Lungs:  CTA CV: RRR   Signed: Fidencio Duddy L 12/18/2011, 7:57 PM

## 2013-08-11 ENCOUNTER — Emergency Department (HOSPITAL_COMMUNITY)
Admission: EM | Admit: 2013-08-11 | Discharge: 2013-08-11 | Disposition: A | Discharge status: Home / Self Care | Payer: Medicare Other | Attending: Emergency Medicine | Admitting: Emergency Medicine

## 2013-08-11 ENCOUNTER — Encounter (HOSPITAL_COMMUNITY): Payer: Self-pay | Admitting: *Deleted

## 2013-08-11 DIAGNOSIS — Z7982 Long term (current) use of aspirin: Secondary | ICD-10-CM | POA: Insufficient documentation

## 2013-08-11 DIAGNOSIS — J449 Chronic obstructive pulmonary disease, unspecified: Secondary | ICD-10-CM | POA: Insufficient documentation

## 2013-08-11 DIAGNOSIS — Z79899 Other long term (current) drug therapy: Secondary | ICD-10-CM | POA: Insufficient documentation

## 2013-08-11 DIAGNOSIS — Z880 Allergy status to penicillin: Secondary | ICD-10-CM | POA: Insufficient documentation

## 2013-08-11 DIAGNOSIS — J4489 Other specified chronic obstructive pulmonary disease: Secondary | ICD-10-CM | POA: Insufficient documentation

## 2013-08-11 DIAGNOSIS — F17200 Nicotine dependence, unspecified, uncomplicated: Secondary | ICD-10-CM | POA: Insufficient documentation

## 2013-08-11 DIAGNOSIS — H109 Unspecified conjunctivitis: Secondary | ICD-10-CM | POA: Insufficient documentation

## 2013-08-11 DIAGNOSIS — I10 Essential (primary) hypertension: Secondary | ICD-10-CM | POA: Insufficient documentation

## 2013-08-11 DIAGNOSIS — Z91040 Latex allergy status: Secondary | ICD-10-CM | POA: Insufficient documentation

## 2013-08-11 DIAGNOSIS — Z96619 Presence of unspecified artificial shoulder joint: Secondary | ICD-10-CM | POA: Insufficient documentation

## 2013-08-11 DIAGNOSIS — Z96659 Presence of unspecified artificial knee joint: Secondary | ICD-10-CM | POA: Insufficient documentation

## 2013-08-11 MED ORDER — FLUORESCEIN SODIUM 1 MG OP STRP
ORAL_STRIP | OPHTHALMIC | Status: AC
  Filled 2013-08-11: qty 1

## 2013-08-11 MED ORDER — TETRACAINE HCL 0.5 % OP SOLN
OPHTHALMIC | Status: AC
  Filled 2013-08-11: qty 2

## 2013-08-11 MED ORDER — TOBRAMYCIN 0.3 % OP SOLN: 1.0000 [drp] | OPHTHALMIC | Status: DC

## 2013-08-11 NOTE — ED Notes (Signed)
Patient with no complaints at this time. Respirations even and unlabored. Skin warm/dry. Discharge instructions reviewed with patient at this time. Patient given opportunity to voice concerns/ask questions. Patient discharged at this time and left Emergency Department with steady gait.   

## 2013-08-11 NOTE — ED Provider Notes (Signed)
CSN: [id number]     Arrival date & time 08/11/13  4098 History  This chart was scribed for Benny Lennert, MD by Greggory Stallion, ED Scribe. This patient was seen in room APA06/APA06 and the patient's care was started at 7:57 AM.  Chief Complaint  Patient presents with  . Eye Drainage   Patient is a 57 y.o. female presenting with eye pain. The history is provided by the patient (pt complains of drainage from right eye). No language interpreter was used.  Eye Pain This is a new problem. The current episode started more than 2 days ago. The problem occurs constantly. The problem has not changed since onset.Pertinent negatives include no chest pain, no abdominal pain and no headaches. Nothing aggravates the symptoms. Nothing relieves the symptoms.    HPI Comments: DORETTE HARTEL is a 57 y.o. female who presents to the Emergency Department complaining of gradually worsening right eye drainage with associated right eye swelling onset 8 days ago. She has tried warm compresses with no relief. Pt states it's matted shut in the mornings. She has no other associated symptoms.   PCP is Dr. Ludwig Clarks  Past Medical History  Diagnosis Date  . COPD (chronic obstructive pulmonary disease)   . Asthma   . Hypertension    Past Surgical History  Procedure Laterality Date  . Total knee arthroplasty    . Total shoulder replacement    . Abdominal hysterectomy    . Cholecystectomy    . Back surgery     No family history on file. History  Substance Use Topics  . Smoking status: Current Some Day Smoker -- 1.00 packs/day for 30 years    Types: Cigarettes  . Smokeless tobacco: Current User  . Alcohol Use: No   OB History   Grav Para Term Preterm Abortions TAB SAB Ect Mult Living                 Review of Systems  Constitutional: Negative for appetite change and fatigue.  HENT: Negative for congestion, sinus pressure and ear discharge.   Eyes: Positive for pain and discharge.  Respiratory:  Negative for cough.   Cardiovascular: Negative for chest pain.  Gastrointestinal: Negative for abdominal pain and diarrhea.  Genitourinary: Negative for frequency and hematuria.  Musculoskeletal: Negative for back pain.  Skin: Negative for rash.  Neurological: Negative for seizures and headaches.  Psychiatric/Behavioral: Negative for hallucinations.    Allergies  Cephalexin; Codeine; Elavil; Gluten; Latex; Midazolam hcl; Morphine and related; Neurontin; Nutritional supplements; Oxycodone hcl er; Penicillins; Vicodin; Wellbutrin; and Xylocaine  Home Medications   Current Outpatient Rx  Name  Route  Sig  Dispense  Refill  . albuterol (PROVENTIL HFA;VENTOLIN HFA) 108 (90 BASE) MCG/ACT inhaler   Inhalation   Inhale 2 puffs into the lungs every 6 (six) hours as needed. Shortness of breath          . EXPIRED: albuterol (PROVENTIL) (5 MG/ML) 0.5% nebulizer solution   Nebulization   Take 0.5 mLs (2.5 mg total) by nebulization every 4 (four) hours as needed for wheezing or shortness of breath.   20 mL   0   . aspirin 325 MG tablet   Oral   Take 325 mg by mouth daily.           . benazepril (LOTENSIN) 10 MG tablet   Oral   Take 10 mg by mouth daily.           Marland Kitchen dexlansoprazole (DEXILANT) 60 MG capsule  Oral   Take 60 mg by mouth daily as needed. indigestion          . methocarbamol (ROBAXIN) 500 MG tablet   Oral   Take 500 mg by mouth 4 (four) times daily.           . Probiotic Product (ALIGN PO)   Oral   Take by mouth.            BP 171/88  Pulse 93  Temp(Src) 98.3 F (36.8 C) (Oral)  Resp 20  Ht 5\' 10"  (1.778 m)  Wt 250 lb (113.399 kg)  BMI 35.87 kg/m2  SpO2 96%  Physical Exam  Constitutional: She is oriented to person, place, and time. She appears well-developed.  HENT:  Head: Normocephalic.  Eyes:  Conjunctiva mildly inflamed. Eyelid is mildly swollen and tender.  Neck: No tracheal deviation present.  Cardiovascular: Normal rate and regular  rhythm.   No murmur heard. Musculoskeletal: Normal range of motion.  Neurological: She is oriented to person, place, and time.  Skin: Skin is warm.  Psychiatric: She has a normal mood and affect.    ED Course  Procedures (including critical care time)  DIAGNOSTIC STUDIES: Oxygen Saturation is 96% on RA, normal by my interpretation.    COORDINATION OF CARE: 8:00 AM-Discussed treatment plan which includes antibiotic eye drops with pt at bedside and pt agreed to plan.   Labs Review Labs Reviewed - No data to display Imaging Review No results found.  MDM  No diagnosis found.  The chart was scribed for me under my direct supervision.  I personally performed the history, physical, and medical decision making and all procedures in the evaluation of this patient.Benny Lennert, MD 08/11/13 [phone]

## 2013-08-11 NOTE — ED Notes (Signed)
Pt c/o drainage of yellow pus, pain, swelling noted to right upper eyelid, pt states that the symptoms started 8 days ago, has been using warm compressions at home with no improvement

## 2016-02-15 ENCOUNTER — Other Ambulatory Visit: Payer: Self-pay | Admitting: Internal Medicine

## 2016-02-15 DIAGNOSIS — F17200 Nicotine dependence, unspecified, uncomplicated: Secondary | ICD-10-CM

## 2016-03-15 ENCOUNTER — Ambulatory Visit: Payer: Medicare Other

## 2016-03-21 ENCOUNTER — Ambulatory Visit
Admission: RE | Admit: 2016-03-21 | Discharge: 2016-03-21 | Disposition: A | Discharge status: Home / Self Care | Payer: Medicare Other | Source: Ambulatory Visit | Attending: Internal Medicine | Admitting: Internal Medicine

## 2016-03-21 DIAGNOSIS — F17200 Nicotine dependence, unspecified, uncomplicated: Secondary | ICD-10-CM

## 2016-04-18 ENCOUNTER — Encounter: Payer: Self-pay | Admitting: Cardiology

## 2016-04-18 ENCOUNTER — Encounter: Payer: Medicare Other | Admitting: Cardiology

## 2016-04-18 NOTE — Progress Notes (Signed)
Patient canceled.  This encounter was created in error - please disregard. 

## 2016-05-11 ENCOUNTER — Encounter: Payer: Self-pay | Admitting: Cardiology

## 2016-05-11 ENCOUNTER — Ambulatory Visit (INDEPENDENT_AMBULATORY_CARE_PROVIDER_SITE_OTHER): Payer: Medicare Other | Admitting: Cardiology

## 2016-05-11 ENCOUNTER — Other Ambulatory Visit: Payer: Self-pay | Admitting: Cardiology

## 2016-05-11 VITALS — BP 126/78 | HR 98 | Ht 67.0 in | Wt 256.0 lb

## 2016-05-11 DIAGNOSIS — E785 Hyperlipidemia, unspecified: Secondary | ICD-10-CM | POA: Diagnosis not present

## 2016-05-11 DIAGNOSIS — I200 Unstable angina: Secondary | ICD-10-CM

## 2016-05-11 DIAGNOSIS — Z889 Allergy status to unspecified drugs, medicaments and biological substances status: Secondary | ICD-10-CM

## 2016-05-11 DIAGNOSIS — I25119 Atherosclerotic heart disease of native coronary artery with unspecified angina pectoris: Secondary | ICD-10-CM | POA: Diagnosis not present

## 2016-05-11 DIAGNOSIS — Z720 Tobacco use: Secondary | ICD-10-CM

## 2016-05-11 DIAGNOSIS — I10 Essential (primary) hypertension: Secondary | ICD-10-CM | POA: Diagnosis not present

## 2016-05-11 MED ORDER — PREDNISONE 20 MG PO TABS: 20.0000 mg | ORAL_TABLET | Freq: Every day | ORAL | Status: DC

## 2016-05-11 MED ORDER — PREDNISONE 20 MG PO TABS: ORAL_TABLET | ORAL | Status: DC

## 2016-05-11 NOTE — Patient Instructions (Addendum)
Your physician has requested that you have a cardiac catheterization. Cardiac catheterization is used to diagnose and/or treat various heart conditions. Doctors may recommend this procedure for a number of different reasons. The most common reason is to evaluate chest pain. Chest pain can be a symptom of coronary artery disease (CAD), and cardiac catheterization can show whether plaque is narrowing or blocking your heart's arteries. This procedure is also used to evaluate the valves, as well as measure the blood flow and oxygen levels in different parts of your heart. For further information please visit [email or web address]. Please follow instruction sheet, as given.   START Aspirin 81 mg daily   Buy Benadryl 25 mg over the counter, take 1 tablet (25 mg) at 6 pm night BEFORE  Cath and at 6 am day of cath  Buy Zantac 150 mg over the counter, take 150 mg at 6 pm night BEFORE cath and at 6 am day of cath   I will send prescription for prednisone 60 mg the night before cath at 6 pm , and 60 mg at 6 am the day of cath     Thank you for choosing [facility] !

## 2016-05-11 NOTE — Progress Notes (Signed)
Cardiology Office Note  Date: 05/11/2016   ID: Meghan Ruffingngela H Kemmerer, DOB 06/22/1956, MRN [id number]  PCP: Ralene OkMOREIRA,ROY, MD  Consulting Cardiologist: Nona DellSamuel Jaydalynn Olivero, MD   Chief Complaint  Patient presents with  . Coronary atherosclerosis by chest CT  . Chest Pain    History of Present Illness: [name] is a 60 y.o. female referred for cardiology consultation by Dr. Ludwig ClarksMoreira. I reviewed her records and updated the chart. She underwent a chest CT scan in April for lung cancer screening with history of tobacco abuse. She had a small right lower lobe nodule and also small calcified granuloma, both described as relatively benign in appearance. Incidentally noted was extensive multivessel distribution coronary artery calcification.  She reports having undergone a cardiac catheterization while she was living in [place] in 2012. This was done in the setting of chest pain. She reports being told that she had a "70% blockage" in a particular artery, but that further intervention was not required at that point. She has not had follow-up ischemic testing in the last 5 years.  She states that over the last 8 months she has been having recurring episodes of midsternal dull chest pressure, occurs with activities such as mopping or sweeping the floors, also walking up steps. Describes a sense of palpitations with this as well. She tries to rest and let the symptoms resolve, sometimes they are prolonged for several hours.   Past Medical History  Diagnosis Date  . COPD (chronic obstructive pulmonary disease) (HCC)   . Essential hypertension   . Hyperlipidemia   . Anxiety   . Gout   . Osteoarthritis   . Fibromyalgia   . Hypothyroidism   . Stress incontinence   . Chronic back pain   . History of colonic polyps   . Prediabetes   . History of pulmonary embolus (PE)     Postoperative 2011  . History of cardiac catheterization     [place] 2012 - recalls having "70%" stenosis in an artery     Past Surgical History  Procedure Laterality Date  . Total knee arthroplasty    . Total shoulder replacement    . Abdominal hysterectomy    . Cholecystectomy    . Cervical disc surgery    . Lumbar spine surgery      Current Outpatient Prescriptions  Medication Sig Dispense Refill  . albuterol (PROVENTIL HFA;VENTOLIN HFA) 108 (90 BASE) MCG/ACT inhaler Inhale 2 puffs into the lungs every 6 (six) hours as needed. Shortness of breath     . aspirin EC 81 MG tablet Take 81 mg by mouth daily.    . benazepril (LOTENSIN) 10 MG tablet Take 10 mg by mouth daily.      Marland Kitchen. dexlansoprazole (DEXILANT) 60 MG capsule Take 60 mg by mouth daily as needed. indigestion     . esomeprazole (NEXIUM) 40 MG capsule Take 40 mg by mouth daily.  5  . HYDROcodone-acetaminophen (NORCO) 7.5-325 MG tablet Take 7.5-325 tablets by mouth 3 (three) times daily.  0  . methocarbamol (ROBAXIN) 500 MG tablet Take 500 mg by mouth 4 (four) times daily.      Marland Kitchen. tobramycin (TOBREX) 0.3 % ophthalmic solution Place 1 drop into the right eye every 4 (four) hours. 5 mL 0  . albuterol (PROVENTIL) (5 MG/ML) 0.5% nebulizer solution Take 0.5 mLs (2.5 mg total) by nebulization every 4 (four) hours as needed for wheezing or shortness of breath. 20 mL 0  . predniSONE (DELTASONE) 20 MG tablet Take  60 mg night before cath,and 60 mg day of cath,rx called to [facility] 6 tablet 0   No current facility-administered medications for this visit.   Allergies:  Cephalexin; Iodine; Lidocaine; Lidocaine hcl; Morphine; Oxycodone; Cephalexin; Codeine; Elavil; Gluten; Latex; Midazolam hcl; Morphine and related; Neurontin; Nutritional supplements; Oxycodone hcl; Penicillins; Vicodin; Wellbutrin; Xylocaine; Penicillin g; and Tobramycin   Social History: The patient  reports that she has been smoking Cigarettes.  She has a 15 pack-year smoking history. She uses smokeless tobacco. She reports that she does not drink alcohol or use illicit drugs.   Family  History: The patient's family history includes Colon cancer in her maternal aunt; Lung disease in her father.   ROS:  Please see the history of present illness. Otherwise, complete review of systems is positive for chronic arthritic and back pain, multiple prior orthopedic surgeries.  All other systems are reviewed and negative.   Physical Exam: VS:  BP 126/78 mmHg  Pulse 98  Ht 5\' 7"  (1.702 m)  Wt 256 lb (116.121 kg)  BMI 40.09 kg/m2  SpO2 94%, BMI Body mass index is 40.09 kg/(m^2).  Wt Readings from Last 3 Encounters:  05/11/16 256 lb (116.121 kg)  08/11/13 250 lb (113.399 kg)  12/06/11 274 lb 12.8 oz (124.648 kg)    General: Obese woman, appears comfortable at rest. HEENT: Conjunctiva and lids normal, oropharynx clear. Neck: Supple, no elevated JVP or carotid bruits, no thyromegaly. Lungs: Diminished breath sounds without wheezing, nonlabored breathing at rest. Cardiac: Regular rate and rhythm, no S3 or significant systolic murmur, no pericardial rub. Abdomen: Soft, nontender, bowel sounds present, no guarding or rebound. Extremities: No pitting edema, distal pulses 2+. Skin: Warm and dry. Musculoskeletal: No kyphosis. Neuropsychiatric: Alert and oriented x3, affect grossly appropriate.  ECG: I personally reviewed the tracing from 11/09/2015 which showed sinus rhythm.  Recent Labwork:  March 2017: TSH 3.0, hemoglobin A1c 5.7, BUN 16, creatinine 0.65, AST 13, ALT 13, cholesterol 251, triglycerides 267, HDL 40, LDL 167  Other Studies Reviewed Today:  Chest CT 03/21/2016: IMPRESSION: 1. Lung-RADS Category 2S, benign appearance or behavior. Continue annual screening with low-dose chest CT without contrast in 12 months. 2. The "S" modifier above refers to potentially clinically significant non lung cancer related findings. Specifically, there is extensive atherosclerosis, including three-vessel coronary artery disease. Please note that although the presence of coronary  artery calcium documents the presence of coronary artery disease, the severity of this disease and any potential stenosis cannot be assessed on this non-gated CT examination. Assessment for potential risk factor modification, dietary therapy or pharmacologic therapy may be warranted, if clinically indicated. 3. Hepatic steatosis.  Assessment and Plan:  1. Chest pain with typical and atypical features, concerning for accelerating angina in light of documented coronary atherosclerosis. She reports a previous history of "70% blockage" based on a cardiac catheterization done in [place] back in 2012 (details not available).extensive multivessel distribution coronary atherosclerosis. Plan is to proceed with a diagnostic cardiac catheterization for further definition of coronary anatomy and assessment for revascularization options. She has multiple drug allergies which need to be reviewed and considered by interventionalist. I am pretreating her for dye allergy given iodine allergy. She also has a latex allergy.  2. Hyperlipidemia, recent LDL 167. Statin therapy should be considered, she is hesitant to try any new medications now given her multiple drug allergies. Will continue to discuss with her.  3. Essential hypertension, blood pressure control is adequate today.  4. Multiple drug allergies and intolerances. Please  refer to chart. Patient even worried about getting a drug-eluting stent. I explained to her that if a stent is needed, the choice of stent would be made by the interventionalist based on her coronary anatomy and best choice to provide the most durable treatment option.  5. COPD with ongoing tobacco abuse. Smoking cessation discussed.  Current medicines were reviewed with the patient today.  Disposition: FU with me after cardiac catheterization.  Signed, Jonelle Sidle, MD, [facility] 05/11/2016 9:12 AM    [facility] [address]  [place], [place] [address] Phone: [phone]; Fax: [phone]

## 2016-05-13 ENCOUNTER — Encounter (HOSPITAL_COMMUNITY): Payer: Self-pay | Admitting: Interventional Cardiology

## 2016-05-13 ENCOUNTER — Encounter (HOSPITAL_COMMUNITY)
Admission: RE | Disposition: A | Discharge status: Home / Self Care | Payer: Self-pay | Source: Ambulatory Visit | Attending: Interventional Cardiology

## 2016-05-13 ENCOUNTER — Ambulatory Visit (HOSPITAL_COMMUNITY)
Admission: RE | Admit: 2016-05-13 | Discharge: 2016-05-13 | Disposition: A | Discharge status: Home / Self Care | Payer: Medicare Other | Source: Ambulatory Visit | Attending: Interventional Cardiology | Admitting: Interventional Cardiology

## 2016-05-13 DIAGNOSIS — Z6841 Body Mass Index (BMI) 40.0 and over, adult: Secondary | ICD-10-CM | POA: Insufficient documentation

## 2016-05-13 DIAGNOSIS — E039 Hypothyroidism, unspecified: Secondary | ICD-10-CM | POA: Diagnosis not present

## 2016-05-13 DIAGNOSIS — M549 Dorsalgia, unspecified: Secondary | ICD-10-CM | POA: Insufficient documentation

## 2016-05-13 DIAGNOSIS — G8929 Other chronic pain: Secondary | ICD-10-CM | POA: Diagnosis not present

## 2016-05-13 DIAGNOSIS — Z7982 Long term (current) use of aspirin: Secondary | ICD-10-CM | POA: Insufficient documentation

## 2016-05-13 DIAGNOSIS — Z86711 Personal history of pulmonary embolism: Secondary | ICD-10-CM | POA: Insufficient documentation

## 2016-05-13 DIAGNOSIS — I25110 Atherosclerotic heart disease of native coronary artery with unstable angina pectoris: Secondary | ICD-10-CM | POA: Diagnosis not present

## 2016-05-13 DIAGNOSIS — J449 Chronic obstructive pulmonary disease, unspecified: Secondary | ICD-10-CM | POA: Insufficient documentation

## 2016-05-13 DIAGNOSIS — M1990 Unspecified osteoarthritis, unspecified site: Secondary | ICD-10-CM | POA: Insufficient documentation

## 2016-05-13 DIAGNOSIS — F17210 Nicotine dependence, cigarettes, uncomplicated: Secondary | ICD-10-CM | POA: Diagnosis not present

## 2016-05-13 DIAGNOSIS — Z91040 Latex allergy status: Secondary | ICD-10-CM | POA: Insufficient documentation

## 2016-05-13 DIAGNOSIS — Z883 Allergy status to other anti-infective agents status: Secondary | ICD-10-CM | POA: Insufficient documentation

## 2016-05-13 DIAGNOSIS — I10 Essential (primary) hypertension: Secondary | ICD-10-CM | POA: Insufficient documentation

## 2016-05-13 DIAGNOSIS — M109 Gout, unspecified: Secondary | ICD-10-CM | POA: Insufficient documentation

## 2016-05-13 DIAGNOSIS — F419 Anxiety disorder, unspecified: Secondary | ICD-10-CM | POA: Insufficient documentation

## 2016-05-13 DIAGNOSIS — E669 Obesity, unspecified: Secondary | ICD-10-CM | POA: Insufficient documentation

## 2016-05-13 DIAGNOSIS — E785 Hyperlipidemia, unspecified: Secondary | ICD-10-CM | POA: Insufficient documentation

## 2016-05-13 DIAGNOSIS — Z885 Allergy status to narcotic agent status: Secondary | ICD-10-CM | POA: Diagnosis not present

## 2016-05-13 DIAGNOSIS — I200 Unstable angina: Secondary | ICD-10-CM | POA: Insufficient documentation

## 2016-05-13 DIAGNOSIS — R7303 Prediabetes: Secondary | ICD-10-CM | POA: Insufficient documentation

## 2016-05-13 DIAGNOSIS — K760 Fatty (change of) liver, not elsewhere classified: Secondary | ICD-10-CM | POA: Diagnosis not present

## 2016-05-13 DIAGNOSIS — Z880 Allergy status to penicillin: Secondary | ICD-10-CM | POA: Insufficient documentation

## 2016-05-13 DIAGNOSIS — I2584 Coronary atherosclerosis due to calcified coronary lesion: Secondary | ICD-10-CM | POA: Diagnosis not present

## 2016-05-13 DIAGNOSIS — M797 Fibromyalgia: Secondary | ICD-10-CM | POA: Insufficient documentation

## 2016-05-13 HISTORY — PX: CARDIAC CATHETERIZATION: SHX172

## 2016-05-13 LAB — BASIC METABOLIC PANEL
ANION GAP: 8 (ref 5–15)
BUN: 20 mg/dL (ref 6–20)
CALCIUM: 9.1 mg/dL (ref 8.9–10.3)
CO2: 27 mmol/L (ref 22–32)
Chloride: 102 mmol/L (ref 101–111)
Creatinine, Ser: 0.74 mg/dL (ref 0.44–1.00)
GFR calc Af Amer: >60 mL/min (ref >60)
GFR calc non Af Amer: >60 mL/min (ref >60)
GLUCOSE: 99 mg/dL (ref 65–99)
Potassium: 3.7 mmol/L (ref 3.5–5.1)
Sodium: 137 mmol/L (ref 135–145)

## 2016-05-13 LAB — CBC
HEMATOCRIT: 43.9 % (ref 36.0–46.0)
HEMOGLOBIN: 13.9 g/dL (ref 12.0–15.0)
MCH: 28.7 pg (ref 26.0–34.0)
MCHC: 31.7 g/dL (ref 30.0–36.0)
MCV: 90.7 fL (ref 78.0–100.0)
Platelets: 254 10*3/uL (ref 150–400)
RBC: 4.84 MIL/uL (ref 3.87–5.11)
RDW: 13.4 % (ref 11.5–15.5)
WBC: 10.5 10*3/uL (ref 4.0–10.5)

## 2016-05-13 LAB — PROTIME-INR
INR: 1.01 (ref 0.00–1.49)
PROTHROMBIN TIME: 13.5 s (ref 11.6–15.2)

## 2016-05-13 SURGERY — LEFT HEART CATH AND CORONARY ANGIOGRAPHY

## 2016-05-13 MED ORDER — HEPARIN (PORCINE) IN NACL 2-0.9 UNIT/ML-% IJ SOLN
INTRAMUSCULAR | Status: DC | PRN
  Administered 2016-05-13: 1500 mL

## 2016-05-13 MED ORDER — SODIUM CHLORIDE 0.9% FLUSH: 3.0000 mL | INTRAVENOUS | Status: DC | PRN

## 2016-05-13 MED ORDER — HEPARIN (PORCINE) IN NACL 2-0.9 UNIT/ML-% IJ SOLN
INTRAMUSCULAR | Status: AC
  Filled 2016-05-13: qty 500

## 2016-05-13 MED ORDER — ASPIRIN 81 MG PO CHEW
81.0000 mg | CHEWABLE_TABLET | ORAL | Status: AC
  Administered 2016-05-13: 81 mg via ORAL

## 2016-05-13 MED ORDER — BUPIVACAINE HCL (PF) 0.25 % IJ SOLN
INTRAMUSCULAR | Status: AC
  Filled 2016-05-13: qty 30

## 2016-05-13 MED ORDER — SODIUM CHLORIDE 0.9% FLUSH
3.0000 mL | Freq: Two times a day (BID) | INTRAVENOUS | Status: DC
Start: 2016-05-13 — End: 2016-05-13

## 2016-05-13 MED ORDER — SODIUM CHLORIDE 0.9 % IV SOLN
INTRAVENOUS | Status: DC
Start: 2016-05-13 — End: 2016-05-13

## 2016-05-13 MED ORDER — SODIUM CHLORIDE 0.9 % IV SOLN: 250.0000 mL | INTRAVENOUS | Status: DC | PRN

## 2016-05-13 MED ORDER — ASPIRIN 81 MG PO CHEW
CHEWABLE_TABLET | ORAL | Status: AC
  Administered 2016-05-13: 81 mg via ORAL
  Filled 2016-05-13: qty 1

## 2016-05-13 MED ORDER — IOPAMIDOL (ISOVUE-370) INJECTION 76%
INTRAVENOUS | Status: AC
  Filled 2016-05-13: qty 100

## 2016-05-13 MED ORDER — HYDROMORPHONE HCL 1 MG/ML IJ SOLN
INTRAMUSCULAR | Status: AC
  Filled 2016-05-13: qty 1

## 2016-05-13 MED ORDER — SODIUM CHLORIDE 0.9 % WEIGHT BASED INFUSION: 1.0000 mL/kg/h | INTRAVENOUS | Status: AC

## 2016-05-13 MED ORDER — HYDROMORPHONE HCL 1 MG/ML IJ SOLN
INTRAMUSCULAR | Status: DC | PRN
  Administered 2016-05-13: 2 mg via INTRAVENOUS

## 2016-05-13 MED ORDER — SODIUM CHLORIDE 0.9 % IV SOLN: INTRAVENOUS | Status: DC | PRN

## 2016-05-13 MED ORDER — BUPIVACAINE HCL (PF) 0.25 % IJ SOLN
INTRAMUSCULAR | Status: DC | PRN
  Administered 2016-05-13: 20 mL

## 2016-05-13 MED ORDER — DIPHENHYDRAMINE HCL 50 MG/ML IJ SOLN
INTRAMUSCULAR | Status: AC
  Administered 2016-05-13: 25 mg via INTRAVENOUS
  Filled 2016-05-13: qty 1

## 2016-05-13 MED ORDER — DIPHENHYDRAMINE HCL 50 MG/ML IJ SOLN
25.0000 mg | Freq: Once | INTRAMUSCULAR | Status: AC
  Administered 2016-05-13: 25 mg via INTRAVENOUS

## 2016-05-13 MED ORDER — HEPARIN (PORCINE) IN NACL 2-0.9 UNIT/ML-% IJ SOLN
INTRAMUSCULAR | Status: AC
  Filled 2016-05-13: qty 1000

## 2016-05-13 MED ORDER — IOPAMIDOL (ISOVUE-370) INJECTION 76%
INTRAVENOUS | Status: DC | PRN
  Administered 2016-05-13: 60 mL via INTRAVENOUS

## 2016-05-13 SURGICAL SUPPLY — 7 items
CATH INFINITI 5FR MULTPACK ANG (CATHETERS) ×3 IMPLANT
KIT HEART LEFT (KITS) ×3 IMPLANT
PACK CARDIAC CATHETERIZATION (CUSTOM PROCEDURE TRAY) ×3 IMPLANT
SHEATH PINNACLE 5F 10CM (SHEATH) ×3 IMPLANT
SYR MEDRAD MARK V 150ML (SYRINGE) ×3 IMPLANT
TRANSDUCER W/STOPCOCK (MISCELLANEOUS) ×3 IMPLANT
WIRE EMERALD 3MM-J .035X150CM (WIRE) ×3 IMPLANT

## 2016-05-13 NOTE — H&P (View-Only) (Signed)
Cardiology Office Note  Date: 05/11/2016   ID: Meghan Ruffingngela H Kemmerer, DOB 06/22/1956, MRN [id number]  PCP: Ralene OkMOREIRA,ROY, MD  Consulting Cardiologist: Nona DellSamuel Duha Abair, MD   Chief Complaint  Patient presents with  . Coronary atherosclerosis by chest CT  . Chest Pain    History of Present Illness: [name] is a 60 y.o. female referred for cardiology consultation by Dr. Ludwig ClarksMoreira. I reviewed her records and updated the chart. She underwent a chest CT scan in April for lung cancer screening with history of tobacco abuse. She had a small right lower lobe nodule and also small calcified granuloma, both described as relatively benign in appearance. Incidentally noted was extensive multivessel distribution coronary artery calcification.  She reports having undergone a cardiac catheterization while she was living in [place] in 2012. This was done in the setting of chest pain. She reports being told that she had a "70% blockage" in a particular artery, but that further intervention was not required at that point. She has not had follow-up ischemic testing in the last 5 years.  She states that over the last 8 months she has been having recurring episodes of midsternal dull chest pressure, occurs with activities such as mopping or sweeping the floors, also walking up steps. Describes a sense of palpitations with this as well. She tries to rest and let the symptoms resolve, sometimes they are prolonged for several hours.   Past Medical History  Diagnosis Date  . COPD (chronic obstructive pulmonary disease) (HCC)   . Essential hypertension   . Hyperlipidemia   . Anxiety   . Gout   . Osteoarthritis   . Fibromyalgia   . Hypothyroidism   . Stress incontinence   . Chronic back pain   . History of colonic polyps   . Prediabetes   . History of pulmonary embolus (PE)     Postoperative 2011  . History of cardiac catheterization     [place] 2012 - recalls having "70%" stenosis in an artery     Past Surgical History  Procedure Laterality Date  . Total knee arthroplasty    . Total shoulder replacement    . Abdominal hysterectomy    . Cholecystectomy    . Cervical disc surgery    . Lumbar spine surgery      Current Outpatient Prescriptions  Medication Sig Dispense Refill  . albuterol (PROVENTIL HFA;VENTOLIN HFA) 108 (90 BASE) MCG/ACT inhaler Inhale 2 puffs into the lungs every 6 (six) hours as needed. Shortness of breath     . aspirin EC 81 MG tablet Take 81 mg by mouth daily.    . benazepril (LOTENSIN) 10 MG tablet Take 10 mg by mouth daily.      Marland Kitchen. dexlansoprazole (DEXILANT) 60 MG capsule Take 60 mg by mouth daily as needed. indigestion     . esomeprazole (NEXIUM) 40 MG capsule Take 40 mg by mouth daily.  5  . HYDROcodone-acetaminophen (NORCO) 7.5-325 MG tablet Take 7.5-325 tablets by mouth 3 (three) times daily.  0  . methocarbamol (ROBAXIN) 500 MG tablet Take 500 mg by mouth 4 (four) times daily.      Marland Kitchen. tobramycin (TOBREX) 0.3 % ophthalmic solution Place 1 drop into the right eye every 4 (four) hours. 5 mL 0  . albuterol (PROVENTIL) (5 MG/ML) 0.5% nebulizer solution Take 0.5 mLs (2.5 mg total) by nebulization every 4 (four) hours as needed for wheezing or shortness of breath. 20 mL 0  . predniSONE (DELTASONE) 20 MG tablet Take  60 mg night before cath,and 60 mg day of cath,[facility] 6 tablet 0   No current facility-administered medications for this visit.   Allergies:  Cephalexin; Iodine; Lidocaine; Lidocaine hcl; Morphine; Oxycodone; Cephalexin; Codeine; Elavil; Gluten; Latex; Midazolam hcl; Morphine and related; Neurontin; Nutritional supplements; Oxycodone hcl; Penicillins; Vicodin; Wellbutrin; Xylocaine; Penicillin g; and Tobramycin   Social History: The patient  reports that she has been smoking Cigarettes.  She has a 15 pack-year smoking history. She uses smokeless tobacco. She reports that she does not drink alcohol or use illicit drugs.   Family  History: The patient's family history includes Colon cancer in her maternal aunt; Lung disease in her father.   ROS:  Please see the history of present illness. Otherwise, complete review of systems is positive for chronic arthritic and back pain, multiple prior orthopedic surgeries.  All other systems are reviewed and negative.   Physical Exam: VS:  BP 126/78 mmHg  Pulse 98  Ht 5\' 7"  (1.702 m)  Wt 256 lb (116.121 kg)  BMI 40.09 kg/m2  SpO2 94%, BMI Body mass index is 40.09 kg/(m^2).  Wt Readings from Last 3 Encounters:  05/11/16 256 lb (116.121 kg)  08/11/13 250 lb (113.399 kg)  12/06/11 274 lb 12.8 oz (124.648 kg)    General: Obese woman, appears comfortable at rest. HEENT: Conjunctiva and lids normal, oropharynx clear. Neck: Supple, no elevated JVP or carotid bruits, no thyromegaly. Lungs: Diminished breath sounds without wheezing, nonlabored breathing at rest. Cardiac: Regular rate and rhythm, no S3 or significant systolic murmur, no pericardial rub. Abdomen: Soft, nontender, bowel sounds present, no guarding or rebound. Extremities: No pitting edema, distal pulses 2+. Skin: Warm and dry. Musculoskeletal: No kyphosis. Neuropsychiatric: Alert and oriented x3, affect grossly appropriate.  ECG: I personally reviewed the tracing from 11/09/2015 which showed sinus rhythm.  Recent Labwork:  March 2017: TSH 3.0, hemoglobin A1c 5.7, BUN 16, creatinine 0.65, AST 13, ALT 13, cholesterol 251, triglycerides 267, HDL 40, LDL 167  Other Studies Reviewed Today:  Chest CT 03/21/2016: IMPRESSION: 1. Lung-RADS Category 2S, benign appearance or behavior. Continue annual screening with low-dose chest CT without contrast in 12 months. 2. The "S" modifier above refers to potentially clinically significant non lung cancer related findings. Specifically, there is extensive atherosclerosis, including three-vessel coronary artery disease. Please note that although the presence of coronary  artery calcium documents the presence of coronary artery disease, the severity of this disease and any potential stenosis cannot be assessed on this non-gated CT examination. Assessment for potential risk factor modification, dietary therapy or pharmacologic therapy may be warranted, if clinically indicated. 3. Hepatic steatosis.  Assessment and Plan:  1. Chest pain with typical and atypical features, concerning for accelerating angina in light of documented coronary atherosclerosis. She reports a previous history of "70% blockage" based on a cardiac catheterization done in [place] back in 2012 (details not available).extensive multivessel distribution coronary atherosclerosis. Plan is to proceed with a diagnostic cardiac catheterization for further definition of coronary anatomy and assessment for revascularization options. She has multiple drug allergies which need to be reviewed and considered by interventionalist. I am pretreating her for dye allergy given iodine allergy. She also has a latex allergy.  2. Hyperlipidemia, recent LDL 167. Statin therapy should be considered, she is hesitant to try any new medications now given her multiple drug allergies. Will continue to discuss with her.  3. Essential hypertension, blood pressure control is adequate today.  4. Multiple drug allergies and intolerances. Please  refer to chart. Patient even worried about getting a drug-eluting stent. I explained to her that if a stent is needed, the choice of stent would be made by the interventionalist based on her coronary anatomy and best choice to provide the most durable treatment option.  5. COPD with ongoing tobacco abuse. Smoking cessation discussed.  Current medicines were reviewed with the patient today.  Disposition: FU with me after cardiac catheterization.  Signed, Jonelle Sidle, MD, [facility] 05/11/2016 9:12 AM    [facility] [address]  [place], [place] [address] Phone: [phone]; Fax: [phone]

## 2016-05-13 NOTE — Discharge Instructions (Signed)
Angiogram, Care After °Refer to this sheet in the next few weeks. These instructions provide you with information about caring for yourself after your procedure. Your health care provider may also give you more specific instructions. Your treatment has been planned according to current medical practices, but problems sometimes occur. Call your health care provider if you have any problems or questions after your procedure. °WHAT TO EXPECT AFTER THE PROCEDURE °After your procedure, it is typical to have the following: °· Bruising at the catheter insertion site that usually fades within 1-2 weeks. °· Blood collecting in the tissue (hematoma) that may be painful to the touch. It should usually decrease in size and tenderness within 1-2 weeks. °HOME CARE INSTRUCTIONS °· Take medicines only as directed by your health care provider. °· You may shower 24-48 hours after the procedure or as directed by your health care provider. Remove the bandage (dressing) and gently wash the site with plain soap and water. Pat the area dry with a clean towel. Do not rub the site, because this may cause bleeding. °· Do not take baths, swim, or use a hot tub until your health care provider approves. °· Check your insertion site every day for redness, swelling, or drainage. °· Do not apply powder or lotion to the site. °· Do not lift over 10 lb (4.5 kg) for 5 days after your procedure or as directed by your health care provider. °· Ask your health care provider when it is okay to: °¨ Return to work or school. °¨ Resume usual physical activities or sports. °¨ Resume sexual activity. °· Do not drive home if you are discharged the same day as the procedure. Have someone else drive you. °· You may drive 24 hours after the procedure unless otherwise instructed by your health care provider. °· Do not operate machinery or power tools for 24 hours after the procedure or as directed by your health care provider. °· If your procedure was done as an  outpatient procedure, which means that you went home the same day as your procedure, a responsible adult should be with you for the first 24 hours after you arrive home. °· Keep all follow-up visits as directed by your health care provider. This is important. °SEEK MEDICAL CARE IF: °· You have a fever. °· You have chills. °· You have increased bleeding from the catheter insertion site. Hold pressure on the site. °SEEK IMMEDIATE MEDICAL CARE IF: °· You have unusual pain at the catheter insertion site. °· You have redness, warmth, or swelling at the catheter insertion site. °· You have drainage (other than a small amount of blood on the dressing) from the catheter insertion site. °· The catheter insertion site is bleeding, and the bleeding does not stop after 30 minutes of holding steady pressure on the site. °· The area near or just beyond the catheter insertion site becomes pale, cool, tingly, or numb. °  °This information is not intended to replace advice given to you by your health care provider. Make sure you discuss any questions you have with your health care provider. °  °Document Released: 06/16/2005 Document Revised: 12/19/2014 Document Reviewed: 05/01/2013 °Elsevier Interactive Patient Education ©2016 [employer] ° °

## 2016-05-13 NOTE — Progress Notes (Signed)
Site area: rt groin Site Prior to Removal:  Level  0 Pressure Applied For:  20 minutes Manual:   yes Patient Status During Pull:  stable Post Pull Site:  Level  0 Post Pull Instructions Given:  Yes  Post Pull Pulses Present: yes Dressing Applied:  tegaderm Bedrest begins @  1025 Comments:

## 2016-05-13 NOTE — Interval H&P Note (Signed)
Cath Lab Visit (complete for each Cath Lab visit)  Clinical Evaluation Leading to the Procedure:   ACS: No.  Non-ACS:    Anginal Classification: CCS III  Anti-ischemic medical therapy: Minimal Therapy (1 class of medications)  Non-Invasive Test Results: No non-invasive testing performed  Prior CABG: No previous CABG      History and Physical Interval Note:  05/13/2016 9:28 AM  [name]  has presented today for surgery, with the diagnosis of angina - cad  The various methods of treatment have been discussed with the patient and family. After consideration of risks, benefits and other options for treatment, the patient has consented to  Procedure(s): Left Heart Cath and Coronary Angiography (N/A) as a surgical intervention .  The patient's history has been reviewed, patient examined, no change in status, stable for surgery.  I have reviewed the patient's chart and labs.  Questions were answered to the patient's satisfaction.     Lance MussJayadeep Varanasi

## 2016-05-13 NOTE — Interval H&P Note (Signed)
History and Physical Interval Note:  05/13/2016 9:33 AM  [name]  has presented today for surgery, with the diagnosis of angina - cad  The various methods of treatment have been discussed with the patient and family. After consideration of risks, benefits and other options for treatment, the patient has consented to  Procedure(s): Left Heart Cath and Coronary Angiography (N/A) as a surgical intervention .  The patient's history has been reviewed, patient examined, no change in status, stable for surgery.  I have reviewed the patient's chart and labs.  Questions were answered to the patient's satisfaction.     THe patient does not want to have cath by the radial approach, despite hearing the potential benefits.  Lance MussJayadeep Iaan Oregel

## 2016-10-24 ENCOUNTER — Encounter (HOSPITAL_COMMUNITY): Payer: Self-pay

## 2016-10-24 ENCOUNTER — Emergency Department (HOSPITAL_COMMUNITY): Payer: Medicare Other

## 2016-10-24 ENCOUNTER — Emergency Department (HOSPITAL_COMMUNITY)
Admission: EM | Admit: 2016-10-24 | Discharge: 2016-10-24 | Disposition: A | Discharge status: Home / Self Care | Payer: Medicare Other | Attending: Emergency Medicine | Admitting: Emergency Medicine

## 2016-10-24 DIAGNOSIS — F17210 Nicotine dependence, cigarettes, uncomplicated: Secondary | ICD-10-CM | POA: Insufficient documentation

## 2016-10-24 DIAGNOSIS — Y929 Unspecified place or not applicable: Secondary | ICD-10-CM | POA: Diagnosis not present

## 2016-10-24 DIAGNOSIS — Y999 Unspecified external cause status: Secondary | ICD-10-CM | POA: Diagnosis not present

## 2016-10-24 DIAGNOSIS — E039 Hypothyroidism, unspecified: Secondary | ICD-10-CM | POA: Diagnosis not present

## 2016-10-24 DIAGNOSIS — Z96619 Presence of unspecified artificial shoulder joint: Secondary | ICD-10-CM | POA: Insufficient documentation

## 2016-10-24 DIAGNOSIS — M79602 Pain in left arm: Secondary | ICD-10-CM | POA: Diagnosis not present

## 2016-10-24 DIAGNOSIS — M79601 Pain in right arm: Secondary | ICD-10-CM | POA: Insufficient documentation

## 2016-10-24 DIAGNOSIS — I10 Essential (primary) hypertension: Secondary | ICD-10-CM | POA: Insufficient documentation

## 2016-10-24 DIAGNOSIS — Z7982 Long term (current) use of aspirin: Secondary | ICD-10-CM | POA: Insufficient documentation

## 2016-10-24 DIAGNOSIS — W01190A Fall on same level from slipping, tripping and stumbling with subsequent striking against furniture, initial encounter: Secondary | ICD-10-CM | POA: Insufficient documentation

## 2016-10-24 DIAGNOSIS — Y939 Activity, unspecified: Secondary | ICD-10-CM | POA: Insufficient documentation

## 2016-10-24 DIAGNOSIS — M79604 Pain in right leg: Secondary | ICD-10-CM | POA: Insufficient documentation

## 2016-10-24 DIAGNOSIS — Z5321 Procedure and treatment not carried out due to patient leaving prior to being seen by health care provider: Secondary | ICD-10-CM | POA: Diagnosis not present

## 2016-10-24 DIAGNOSIS — M79605 Pain in left leg: Secondary | ICD-10-CM | POA: Diagnosis not present

## 2016-10-24 DIAGNOSIS — J449 Chronic obstructive pulmonary disease, unspecified: Secondary | ICD-10-CM | POA: Insufficient documentation

## 2016-10-24 DIAGNOSIS — Z96659 Presence of unspecified artificial knee joint: Secondary | ICD-10-CM | POA: Insufficient documentation

## 2016-10-24 LAB — CBC
HCT: 43.2 % (ref 36.0–46.0)
Hemoglobin: 14 g/dL (ref 12.0–15.0)
MCH: 29.5 pg (ref 26.0–34.0)
MCHC: 32.4 g/dL (ref 30.0–36.0)
MCV: 91.1 fL (ref 78.0–100.0)
PLATELETS: 259 10*3/uL (ref 150–400)
RBC: 4.74 MIL/uL (ref 3.87–5.11)
RDW: 13.7 % (ref 11.5–15.5)
WBC: 9.9 10*3/uL (ref 4.0–10.5)

## 2016-10-24 LAB — COMPREHENSIVE METABOLIC PANEL
ALBUMIN: 3.6 g/dL (ref 3.5–5.0)
ALK PHOS: 52 U/L (ref 38–126)
ALT: 19 U/L (ref 14–54)
ANION GAP: 10 (ref 5–15)
AST: 18 U/L (ref 15–41)
BILIRUBIN TOTAL: 0.3 mg/dL (ref 0.3–1.2)
BUN: 19 mg/dL (ref 6–20)
CALCIUM: 8.9 mg/dL (ref 8.9–10.3)
CO2: 24 mmol/L (ref 22–32)
Chloride: 104 mmol/L (ref 101–111)
Creatinine, Ser: 0.96 mg/dL (ref 0.44–1.00)
GLUCOSE: 100 mg/dL — AB (ref 65–99)
Potassium: 4.5 mmol/L (ref 3.5–5.1)
Sodium: 138 mmol/L (ref 135–145)
TOTAL PROTEIN: 6.6 g/dL (ref 6.5–8.1)

## 2016-10-24 LAB — I-STAT TROPONIN, ED: Troponin i, poc: 0 ng/mL (ref 0.00–0.08)

## 2016-10-24 NOTE — ED Notes (Signed)
Pt. Stated, I just can't wait any longer. Explained the wait to pt.

## 2016-10-24 NOTE — ED Triage Notes (Signed)
Pt presents with bilateral arm and leg pain after tripping and falling into dresser Friday night.  -LOC, pt reports pain and swelling to both arms and legs.  Pt reports shortness of breath and chest pain since falling.  Pt has h/o PE and DVT.

## 2016-10-26 ENCOUNTER — Ambulatory Visit (HOSPITAL_COMMUNITY): Admission: RE | Admit: 2016-10-26 | Payer: Medicare Other | Source: Ambulatory Visit

## 2016-10-26 ENCOUNTER — Other Ambulatory Visit (HOSPITAL_COMMUNITY): Payer: Self-pay | Admitting: Orthopedic Surgery

## 2016-10-26 DIAGNOSIS — M79662 Pain in left lower leg: Secondary | ICD-10-CM

## 2017-05-26 ENCOUNTER — Ambulatory Visit (INDEPENDENT_AMBULATORY_CARE_PROVIDER_SITE_OTHER): Payer: Medicare Other | Admitting: Adult Health

## 2017-05-26 ENCOUNTER — Encounter: Payer: Self-pay | Admitting: Adult Health

## 2017-05-26 VITALS — BP 104/73 | HR 90 | Ht 67.0 in | Wt 252.0 lb

## 2017-05-26 DIAGNOSIS — I2510 Atherosclerotic heart disease of native coronary artery without angina pectoris: Secondary | ICD-10-CM | POA: Diagnosis not present

## 2017-05-26 DIAGNOSIS — I10 Essential (primary) hypertension: Secondary | ICD-10-CM | POA: Diagnosis not present

## 2017-05-26 DIAGNOSIS — Z01810 Encounter for preprocedural cardiovascular examination: Secondary | ICD-10-CM

## 2017-05-26 NOTE — Patient Instructions (Signed)
Medication Instructions:   Your physician recommends that you continue on your current medications as directed. Please refer to the Current Medication list given to you today.  Labwork:  NONE  Testing/Procedures: NONE  Follow-Up:  Your physician recommends that you schedule a follow-up appointment in: 6-9 months. You will receive a reminder letter in the mail in about 4 months reminding you to call and schedule your appointment. If you don't receive this letter, please contact our office.  Any Other Special Instructions Will Be Listed Below (If Applicable).  You have been cleared to have your left shoulder surgery. Your cardiac clearance will be sent to your surgeon.  If you need a refill on your cardiac medications before your next appointment, please call your pharmacy.

## 2017-05-26 NOTE — Progress Notes (Signed)
Cardiology Office Note   Date:  05/26/2017   ID:  Meghan Ruffingngela H Jacobsen, DOB 12/01/1956, MRN [id number]  PCP:  Patient, No Pcp Per  Cardiologist: Diona BrownerMcDowell  Chief Complaint  Patient presents with  . Chest Pain  . Coronary Artery Disease    History of Present Illness: [name] is a 61 y.o. female who presents for ongoing assessment and management of chest pain, with CAD and angina artery, who was seen for the first time by Dr. Diona BrownerMcDowell on 05/11/2016. The patient was sent for cardiac catheterization for reevaluation for progressive coronary artery disease in the setting of recurrent chest pain.  Catheterization revealed proximal RCA 50% stenosis, and mid LAD 30% stenosis, mid circumflex 10% stenosis, LVEF was normal. She has not been seen since cardiac catheterization. Normal wall motion.  She comes today for preoperative evaluation to have left shoulder surgery, to repair and remove bone spurs and torn ligaments. She denies recurrent chest pain, dyspnea on exertion, dizziness, or weakness. She remains active but is unable to lift anything due to her shoulder pain. She is medically compliant. She refuses statin therapy. She has multiple allergies and does not wish to consider cholesterol reducing medication.  Past Medical History:  Diagnosis Date  . Anxiety   . Chronic back pain   . COPD (chronic obstructive pulmonary disease) (HCC)   . Essential hypertension   . Fibromyalgia   . Gout   . History of cardiac catheterization    [place] 2012 - recalls having "70%" stenosis in an artery  . History of colonic polyps   . History of pulmonary embolus (PE)    Postoperative 2011  . Hyperlipidemia   . Hypothyroidism   . Osteoarthritis   . Prediabetes   . Stress incontinence     Past Surgical History:  Procedure Laterality Date  . ABDOMINAL HYSTERECTOMY    . CARDIAC CATHETERIZATION N/A 05/13/2016   Procedure: Left Heart Cath and Coronary Angiography;  Surgeon: Corky CraftsJayadeep S Varanasi,  MD;  [facility];  Service: Cardiovascular;  Laterality: N/A;  . CERVICAL DISC SURGERY    . CHOLECYSTECTOMY    . LUMBAR SPINE SURGERY    . TOTAL KNEE ARTHROPLASTY    . TOTAL SHOULDER REPLACEMENT       Current Outpatient Prescriptions  Medication Sig Dispense Refill  . albuterol (PROVENTIL HFA;VENTOLIN HFA) 108 (90 BASE) MCG/ACT inhaler Inhale 2 puffs into the lungs every 6 (six) hours as needed. Shortness of breath     . benazepril (LOTENSIN) 20 MG tablet Take 20 mg by mouth 2 (two) times daily.  2  . HYDROcodone-acetaminophen (NORCO/VICODIN) 5-325 MG tablet Take 1 tablet by mouth every 4 (four) hours as needed for moderate pain.     No current facility-administered medications for this visit.     Allergies:   Cephalexin; Codeine; Iodine; Lidocaine; Lidocaine hcl; Morphine; Neurontin [gabapentin]; Oxycodone; Penicillin g; Penicillins; Xylocaine [lidocaine hcl]; Elavil [amitriptyline hcl]; Gluten; Latex; Midazolam hcl; Nutritional supplements; Wellbutrin [bupropion hcl]; and Tobramycin    Social History:  The patient  reports that she quit smoking about 15 months ago. Her smoking use included Cigarettes. She has a 15.00 pack-year smoking history. She uses smokeless tobacco. She reports that she does not drink alcohol or use drugs.   Family History:  The patient's family history includes Colon cancer in her maternal aunt; Lung disease in her father.    ROS: All other systems are reviewed and negative. Unless otherwise mentioned in H&P    PHYSICAL  EXAM: VS:  BP 104/73   Pulse 90   Ht 5\' 7"  (1.702 m)   Wt 252 lb (114.3 kg)   BMI 39.47 kg/m  , BMI Body mass index is 39.47 kg/m. GEN: Well nourished, well developed, in no acute distress Obese. HEENT: normal  Neck: no JVD, carotid bruits, or masses Cardiac: RRR; Tachycardic, no murmurs, rubs, or gallops,no edema  Respiratory:  clear to auscultation bilaterally, normal work of breathing GI: soft, nontender,  nondistended, + BS MS: no deformity or atrophy pain with movement of left shoulder, no range of motion was elicited. Skin: warm and dry, no rash Neuro:  Strength and sensation are intact Psych: euthymic mood, full affect   EKG:  Normal sinus rhythm, sinus tachycardia rate of 90 beats per minutes. T-wave inversion in lead one, aVL. Nonspecific.   Recent Labs: 10/24/2016: ALT 19; BUN 19; Creatinine, Ser 0.96; Hemoglobin 14.0; Platelets 259; Potassium 4.5; Sodium 138     Wt Readings from Last 3 Encounters:  05/26/17 252 lb (114.3 kg)  05/13/16 260 lb (117.9 kg)  05/11/16 256 lb (116.1 kg)       ASSESSMENT AND PLAN:  1.  Coronary artery disease: Status post cardiac catheterization in June 2017. This revealed a proximal 50% RCA lesion, mid LAD lesion of 30%, mid circumflex at 10%, with normal LV systolic function. She is without complaints of chest pain dyspnea or fatigue. She is not on statin therapy with known coronary artery plaquing. She refuses cholesterol reducing medications as she has so many drug intolerances. She prefers to keep it diet-controlled. I've advised her to begin fish oil. Follow-up labs for primary care are recommended.  She is of low to moderate but acceptable risk  from cardiovascular standpoint to have left shoulder repair. Heart rate is mildly elevated on this visit, she states she is nervous. Can consider perioperative low-dose metoprolol if heart rate remains elevated. Letter  will be sent to Dr. Guss Bunde, [facility] orthopedics. Cardiology is available as needed perioperatively.  2. History of hypertension: Remains on low-dose benazepril 20 mg twice a day. Patient's blood pressure is low normal today. She is also on pain medication which may be affecting. She denies any dizziness, or near syncope.  3. Preoperative cardiac exam: The patient appears stable from a cardiovascular standpoint symptomatically stable blood pressure. Heart rate is mildly elevated as  listed. Can consider low-dose beta blocker if necessary perioperatively.     Current medicines are reviewed at length with the patient today.    Labs/ tests ordered today include:  Bettey Mare. Liborio Nixon, ANP, AACC   05/26/2017 2:44 PM    [facility] [address], [place], [place] [address] Phone: [phone]; Fax: [phone]

## 2017-06-06 NOTE — Pre-Procedure Instructions (Signed)
Sherry Ruffingngela H Casso  06/06/2017      [facility] [phone] - [place], [place] - [address] [place] [address] Phone: [phone] Fax: [phone]  [facility] - [place], [place] - [address] [place] [id number] Phone: [phone] Fax: [phone]    Your procedure is scheduled on June 29  [facility].M.  Call this number if you have problems the morning of surgery:  [phone]   Remember:  Do not eat food or drink liquids after midnight.   Take these medicines the morning of surgery with A SIP OF WATER albuterol (PROVENTIL HFA;VENTOLIN HFA) bring with you the day of surgery, HYDROcodone-acetaminophen (NORCO/VICODIN) if needed, methocarbamol (ROBAXIN)   7 days prior to surgery STOP taking any Aspirin, Aleve, Naproxen, Ibuprofen, Motrin, Advil, Goody's, BC's, all herbal medications, fish oil, and all vitamins    Do not wear jewelry, make-up or nail polish.  Do not wear lotions, powders, or perfumes, or deoderant.  Do not shave 48 hours prior to surgery.    Do not bring valuables to the hospital.  [facility] is not responsible for any belongings or valuables.  Contacts, dentures or bridgework may not be worn into surgery.  Leave your suitcase in the car.  After surgery it may be brought to your room.  For patients admitted to the hospital, discharge time will be determined by your treatment team.  Patients discharged the day of surgery will not be allowed to drive home.    Special instructions:   [facility]- Preparing For Surgery  Before surgery, you can play an important role. Because skin is not sterile, your skin needs to be as free of germs as possible. You can reduce the number of germs on your skin by washing with CHG (chlorahexidine gluconate) Soap before surgery.  CHG is an antiseptic cleaner which kills germs and bonds  with the skin to continue killing germs even after washing.  Please do not use if you have an allergy to CHG or antibacterial soaps. If your skin becomes reddened/irritated stop using the CHG.  Do not shave (including legs and underarms) for at least 48 hours prior to first CHG shower. It is OK to shave your face.  Please follow these instructions carefully.   1. Shower the NIGHT BEFORE SURGERY and the MORNING OF SURGERY with CHG.   2. If you chose to wash your hair, wash your hair first as usual with your normal shampoo.  3. After you shampoo, rinse your hair and body thoroughly to remove the shampoo.  4. Use CHG as you would any other liquid soap. You can apply CHG directly to the skin and wash gently with a scrungie or a clean washcloth.   5. Apply the CHG Soap to your body ONLY FROM THE NECK DOWN.  Do not use on open wounds or open sores. Avoid contact with your eyes, ears, mouth and genitals (private parts). Wash genitals (private parts) with your normal soap.  6. Wash thoroughly, paying special attention to the area where your surgery will be performed.  7. Thoroughly rinse your body with warm water from the neck down.  8. DO NOT shower/wash with your normal soap after using and rinsing off the CHG Soap.  9. Pat yourself dry with a CLEAN TOWEL.   10. Wear CLEAN PAJAMAS  11. Place CLEAN SHEETS on your bed the night of your first shower and DO NOT SLEEP WITH PETS.    Day of Surgery: Do not apply any deodorants/lotions. Please wear clean clothes to the hospital/surgery center.      Please read over the following fact sheets that you were given.

## 2017-06-06 NOTE — H&P (Signed)
[name] is an 61 y.o. female.    Chief Complaint: left shoulder pain  HPI: Pt is a 61 y.o. female complaining of left shoulder pain for multiple years. Pain had continually increased since the beginning. X-rays in the clinic show end-stage arthritic changes of the left shoulder. Pt has tried various conservative treatments which have failed to alleviate their symptoms, including injections and therapy. Various options are discussed with the patient. Risks, benefits and expectations were discussed with the patient. Patient understand the risks, benefits and expectations and wishes to proceed with surgery.   PCP:  Patient, No Pcp Per  D/C Plans: Home  PMH: Past Medical History:  Diagnosis Date  . Anxiety   . Chronic back pain   . COPD (chronic obstructive pulmonary disease) (HCC)   . Essential hypertension   . Fibromyalgia   . Gout   . History of cardiac catheterization    [place] 2012 - recalls having "70%" stenosis in an artery  . History of colonic polyps   . History of pulmonary embolus (PE)    Postoperative 2011  . Hyperlipidemia   . Hypothyroidism   . Osteoarthritis   . Prediabetes   . Stress incontinence     PSH: Past Surgical History:  Procedure Laterality Date  . ABDOMINAL HYSTERECTOMY    . CARDIAC CATHETERIZATION N/A 05/13/2016   Procedure: Left Heart Cath and Coronary Angiography;  Surgeon: Corky CraftsJayadeep S Varanasi, MD;  [facility];  Service: Cardiovascular;  Laterality: N/A;  . CERVICAL DISC SURGERY    . CHOLECYSTECTOMY    . LUMBAR SPINE SURGERY    . TOTAL KNEE ARTHROPLASTY    . TOTAL SHOULDER REPLACEMENT      Social History:  reports that she quit smoking about 15 months ago. Her smoking use included Cigarettes. She has a 15.00 pack-year smoking history. She uses smokeless tobacco. She reports that she does not drink alcohol or use drugs.  Allergies:  Allergies  Allergen Reactions  . Bee Venom Anaphylaxis  . Cephalexin Anaphylaxis    . Codeine Anaphylaxis, Shortness Of Breath and Rash  . Flexeril [Cyclobenzaprine] Shortness Of Breath, Swelling and Rash  . Iodine Anaphylaxis  . Lidocaine Anaphylaxis  . Lidocaine Hcl Anaphylaxis  . Morphine Anaphylaxis  . Neurontin [Gabapentin] Anaphylaxis, Shortness Of Breath and Rash    Throat starts to close up and difficulty breathing  . Oxycodone Anaphylaxis  . Penicillin G Anaphylaxis and Rash  . Penicillins Anaphylaxis and Rash    Has patient had a PCN reaction causing immediate rash, facial/tongue/throat swelling, SOB or lightheadedness with hypotension: Yes Has patient had a PCN reaction causing severe rash involving mucus membranes or skin necrosis: No Has patient had a PCN reaction that required hospitalization Yes Has patient had a PCN reaction occurring within the last 10 years: No If all of the above answers are "NO", then may proceed with Cephalosporin use.   . Xylocaine [Lidocaine Hcl] Anaphylaxis  . Elavil [Amitriptyline Hcl] Other (See Comments)    Hallucinations  . Gluten Diarrhea and Swelling    No wheat, abdomen swelling  . Latex Other (See Comments)    2nd and 3rd degree burn of skin  . Nutritional Supplements   . Wellbutrin [Bupropion Hcl] Nausea And Vomiting    Blackouts  . Tobramycin Rash    Medications: No current facility-administered medications for this encounter.    Current Outpatient Prescriptions  Medication Sig Dispense Refill  . albuterol (PROVENTIL HFA;VENTOLIN HFA) 108 (90 BASE) MCG/ACT inhaler  Inhale 2 puffs into the lungs every 6 (six) hours as needed for wheezing or shortness of breath.     . benazepril (LOTENSIN) 20 MG tablet Take 20 mg by mouth See admin instructions. Usually takes once daily but blood pressure has been running high and pt will take it twice daily as needed  2  . EPINEPHrine (EPIPEN 2-PAK) 0.3 mg/0.3 mL IJ SOAJ injection Inject 0.3 mg into the muscle once.    Marland Kitchen HYDROcodone-acetaminophen (NORCO/VICODIN) 5-325 MG tablet  Take 1 tablet by mouth every 4 (four) hours as needed for moderate pain.    . methocarbamol (ROBAXIN) 500 MG tablet Take 500 mg by mouth every 6 (six) hours as needed for muscle spasms.      No results found for this or any previous visit (from the past 48 hour(s)). No results found.  ROS: Pain with rom of the left upper extremity  Physical Exam:  Alert and oriented 61 y.o. female in no acute distress Cranial nerves 2-12 intact Cervical spine: full rom with no tenderness, nv intact distally Chest: active breath sounds bilaterally, no wheeze rhonchi or rales Heart: regular rate and rhythm, no murmur Abd: non tender non distended with active bowel sounds Hip is stable with rom  Left shoulder with limited rom and strength  nv intact distally Strength 4/5 with ER and IR Crepitus with rom  Assessment/Plan Assessment: left shoulder end stage osteoarthritis  Plan: Patient will undergo a left total shoulder by Dr. Ranell Patrick at [facility]. Risks benefits and expectations were discussed with the patient. Patient understand risks, benefits and expectations and wishes to proceed.

## 2017-06-07 ENCOUNTER — Encounter (HOSPITAL_COMMUNITY): Payer: Self-pay

## 2017-06-07 ENCOUNTER — Encounter (HOSPITAL_COMMUNITY)
Admission: RE | Admit: 2017-06-07 | Discharge: 2017-06-07 | Disposition: A | Discharge status: Home / Self Care | Payer: Medicare Other | Source: Ambulatory Visit | Attending: Orthopedic Surgery | Admitting: Orthopedic Surgery

## 2017-06-07 LAB — CBC
HEMATOCRIT: 45 % (ref 36.0–46.0)
Hemoglobin: 14.6 g/dL (ref 12.0–15.0)
MCH: 29.7 pg (ref 26.0–34.0)
MCHC: 32.4 g/dL (ref 30.0–36.0)
MCV: 91.5 fL (ref 78.0–100.0)
PLATELETS: 286 10*3/uL (ref 150–400)
RBC: 4.92 MIL/uL (ref 3.87–5.11)
RDW: 14.3 % (ref 11.5–15.5)
WBC: 9.8 10*3/uL (ref 4.0–10.5)

## 2017-06-07 LAB — SURGICAL PCR SCREEN
MRSA, PCR: NEGATIVE
Staphylococcus aureus: NEGATIVE

## 2017-06-07 LAB — BASIC METABOLIC PANEL
Anion gap: 10 (ref 5–15)
BUN: 20 mg/dL (ref 6–20)
CHLORIDE: 104 mmol/L (ref 101–111)
CO2: 23 mmol/L (ref 22–32)
Calcium: 8.9 mg/dL (ref 8.9–10.3)
Creatinine, Ser: 0.69 mg/dL (ref 0.44–1.00)
Glucose, Bld: 106 mg/dL — ABNORMAL HIGH (ref 65–99)
POTASSIUM: 4.5 mmol/L (ref 3.5–5.1)
SODIUM: 137 mmol/L (ref 135–145)

## 2017-06-07 NOTE — Progress Notes (Signed)
PCP - denies Cardiologist -Dr. Diona BrownerMcdowell - saw last friday  Chest x-ray - not needed EKG - 05/26/17 Stress Test - 07/15/09 ECHO - 07/13/09 Cardiac Cath -05/13/16   Sending to anesthesia for review of heart history - cardiology note in epic 05/26/17   Patient denies shortness of breath, fever, cough and chest pain at PAT appointment   Patient verbalized understanding of instructions that were given to them at the PAT appointment. Patient was also instructed that they will need to review over the PAT instructions again at home before surgery.

## 2017-06-07 NOTE — Progress Notes (Signed)
Anesthesia Chart Review:  Pt is a 61 year old female scheduled for L shoulder anatomic total shoulder arthroplasty on 06/09/2017 with Beverely LowSteve Norris, MD.  - Cardiologist is Nona DellSamuel McDowell, M.D. Cleared for surgery at low-moderate but acceptable risk at last office visit 05/26/17 with Joni ReiningKathryn Lawrence, NP  PMH includes:  CAD, HTN, PE (post-op in 2011), hyperlipidemia, hypothyroidism, COPD.  Former smoker. BMI 37  Medications include: Albuterol, benazepril  Preoperative labs reviewed.  EKG 05/26/17: NSR. T-wave inversion in lead one, aVL. Nonspecific.   Cardiac cath 05/13/16:   Prox RCA lesion, 50% stenosed.  Mid LAD lesion, 30% stenosed.  Mid Cx lesion, 10% stenosed.  The left ventricular systolic function is normal.  If no changes, I anticipate pt can proceed with surgery as scheduled.  Rica Mastngela Tacoya Altizer, FNP-BC [facility]Anesthesiology Phone: [phone] 06/07/2017 3:32 PM

## 2017-06-08 MED ORDER — CLINDAMYCIN PHOSPHATE 900 MG/50ML IV SOLN
900.0000 mg | INTRAVENOUS | Status: AC
Start: 2017-06-09 — End: 2017-06-09
  Administered 2017-06-09: 900 mg via INTRAVENOUS
  Filled 2017-06-08: qty 50

## 2017-06-09 ENCOUNTER — Inpatient Hospital Stay (HOSPITAL_COMMUNITY)
Admission: RE | Admit: 2017-06-09 | Discharge: 2017-06-10 | DRG: 483 | Disposition: A | Discharge status: Home / Self Care | Payer: Medicare Other | Source: Ambulatory Visit | Attending: Orthopedic Surgery | Admitting: Orthopedic Surgery

## 2017-06-09 ENCOUNTER — Encounter (HOSPITAL_COMMUNITY): Payer: Self-pay | Admitting: Anesthesiology

## 2017-06-09 ENCOUNTER — Inpatient Hospital Stay (HOSPITAL_COMMUNITY): Payer: Medicare Other | Admitting: Emergency Medicine

## 2017-06-09 ENCOUNTER — Inpatient Hospital Stay (HOSPITAL_COMMUNITY): Payer: Medicare Other

## 2017-06-09 ENCOUNTER — Inpatient Hospital Stay (HOSPITAL_COMMUNITY): Payer: Medicare Other | Admitting: Anesthesiology

## 2017-06-09 ENCOUNTER — Encounter (HOSPITAL_COMMUNITY)
Admission: RE | Disposition: A | Discharge status: Home / Self Care | Payer: Self-pay | Source: Ambulatory Visit | Attending: Orthopedic Surgery

## 2017-06-09 DIAGNOSIS — M109 Gout, unspecified: Secondary | ICD-10-CM | POA: Diagnosis present

## 2017-06-09 DIAGNOSIS — Z91040 Latex allergy status: Secondary | ICD-10-CM

## 2017-06-09 DIAGNOSIS — Z91030 Bee allergy status: Secondary | ICD-10-CM | POA: Diagnosis not present

## 2017-06-09 DIAGNOSIS — G8929 Other chronic pain: Secondary | ICD-10-CM | POA: Diagnosis present

## 2017-06-09 DIAGNOSIS — Z888 Allergy status to other drugs, medicaments and biological substances status: Secondary | ICD-10-CM | POA: Diagnosis not present

## 2017-06-09 DIAGNOSIS — Z881 Allergy status to other antibiotic agents status: Secondary | ICD-10-CM | POA: Diagnosis not present

## 2017-06-09 DIAGNOSIS — I10 Essential (primary) hypertension: Secondary | ICD-10-CM | POA: Diagnosis present

## 2017-06-09 DIAGNOSIS — Z885 Allergy status to narcotic agent status: Secondary | ICD-10-CM | POA: Diagnosis not present

## 2017-06-09 DIAGNOSIS — E785 Hyperlipidemia, unspecified: Secondary | ICD-10-CM | POA: Diagnosis present

## 2017-06-09 DIAGNOSIS — Z86711 Personal history of pulmonary embolism: Secondary | ICD-10-CM | POA: Diagnosis not present

## 2017-06-09 DIAGNOSIS — Z9049 Acquired absence of other specified parts of digestive tract: Secondary | ICD-10-CM | POA: Diagnosis not present

## 2017-06-09 DIAGNOSIS — R7303 Prediabetes: Secondary | ICD-10-CM | POA: Diagnosis present

## 2017-06-09 DIAGNOSIS — Z96659 Presence of unspecified artificial knee joint: Secondary | ICD-10-CM | POA: Diagnosis present

## 2017-06-09 DIAGNOSIS — E039 Hypothyroidism, unspecified: Secondary | ICD-10-CM | POA: Diagnosis present

## 2017-06-09 DIAGNOSIS — K9041 Non-celiac gluten sensitivity: Secondary | ICD-10-CM | POA: Diagnosis present

## 2017-06-09 DIAGNOSIS — M549 Dorsalgia, unspecified: Secondary | ICD-10-CM | POA: Diagnosis present

## 2017-06-09 DIAGNOSIS — Z90710 Acquired absence of both cervix and uterus: Secondary | ICD-10-CM | POA: Diagnosis not present

## 2017-06-09 DIAGNOSIS — I2510 Atherosclerotic heart disease of native coronary artery without angina pectoris: Secondary | ICD-10-CM | POA: Diagnosis present

## 2017-06-09 DIAGNOSIS — M797 Fibromyalgia: Secondary | ICD-10-CM | POA: Diagnosis present

## 2017-06-09 DIAGNOSIS — M25512 Pain in left shoulder: Secondary | ICD-10-CM | POA: Diagnosis present

## 2017-06-09 DIAGNOSIS — J449 Chronic obstructive pulmonary disease, unspecified: Secondary | ICD-10-CM | POA: Diagnosis present

## 2017-06-09 DIAGNOSIS — Z87891 Personal history of nicotine dependence: Secondary | ICD-10-CM

## 2017-06-09 DIAGNOSIS — M19012 Primary osteoarthritis, left shoulder: Principal | ICD-10-CM | POA: Diagnosis present

## 2017-06-09 DIAGNOSIS — Z880 Allergy status to penicillin: Secondary | ICD-10-CM

## 2017-06-09 DIAGNOSIS — Z96612 Presence of left artificial shoulder joint: Secondary | ICD-10-CM

## 2017-06-09 HISTORY — PX: TOTAL SHOULDER ARTHROPLASTY: SHX126

## 2017-06-09 LAB — CBC
HEMATOCRIT: 41 % (ref 36.0–46.0)
Hemoglobin: 13 g/dL (ref 12.0–15.0)
MCH: 29.1 pg (ref 26.0–34.0)
MCHC: 31.7 g/dL (ref 30.0–36.0)
MCV: 91.7 fL (ref 78.0–100.0)
PLATELETS: 228 10*3/uL (ref 150–400)
RBC: 4.47 MIL/uL (ref 3.87–5.11)
RDW: 14.4 % (ref 11.5–15.5)
WBC: 12.3 10*3/uL — ABNORMAL HIGH (ref 4.0–10.5)

## 2017-06-09 LAB — CREATININE, SERUM
Creatinine, Ser: 0.85 mg/dL (ref 0.44–1.00)
GFR calc Af Amer: >60 mL/min (ref >60)

## 2017-06-09 SURGERY — ARTHROPLASTY, SHOULDER, TOTAL
Anesthesia: Regional | Site: Shoulder | Laterality: Left

## 2017-06-09 MED ORDER — HYDROMORPHONE HCL 2 MG PO TABS
2.0000 mg | ORAL_TABLET | ORAL | Status: DC | PRN
  Administered 2017-06-10: 2 mg via ORAL
  Filled 2017-06-09: qty 1

## 2017-06-09 MED ORDER — POLYETHYLENE GLYCOL 3350 17 G PO PACK: 17.0000 g | PACK | Freq: Every day | ORAL | Status: DC | PRN

## 2017-06-09 MED ORDER — FAMOTIDINE IN NACL 20-0.9 MG/50ML-% IV SOLN
INTRAVENOUS | Status: AC
  Filled 2017-06-09: qty 50

## 2017-06-09 MED ORDER — PHENOL 1.4 % MT LIQD: 1.0000 | OROMUCOSAL | Status: DC | PRN

## 2017-06-09 MED ORDER — HYDROMORPHONE HCL 1 MG/ML IJ SOLN
INTRAMUSCULAR | Status: AC
  Filled 2017-06-09: qty 1

## 2017-06-09 MED ORDER — ONDANSETRON HCL 4 MG/2ML IJ SOLN: 4.0000 mg | Freq: Four times a day (QID) | INTRAMUSCULAR | Status: DC | PRN

## 2017-06-09 MED ORDER — SUGAMMADEX SODIUM 200 MG/2ML IV SOLN
INTRAVENOUS | Status: AC
  Filled 2017-06-09: qty 2

## 2017-06-09 MED ORDER — LIDOCAINE 2% (20 MG/ML) 5 ML SYRINGE
INTRAMUSCULAR | Status: AC
  Filled 2017-06-09: qty 5

## 2017-06-09 MED ORDER — FENTANYL CITRATE (PF) 100 MCG/2ML IJ SOLN
INTRAMUSCULAR | Status: AC
  Administered 2017-06-09: 100 ug via INTRAVENOUS
  Filled 2017-06-09: qty 2

## 2017-06-09 MED ORDER — ALBUTEROL SULFATE (2.5 MG/3ML) 0.083% IN NEBU
2.5000 mg | INHALATION_SOLUTION | Freq: Four times a day (QID) | RESPIRATORY_TRACT | Status: DC | PRN
  Administered 2017-06-10: 2.5 mg via RESPIRATORY_TRACT
  Filled 2017-06-09: qty 3

## 2017-06-09 MED ORDER — BUPIVACAINE-EPINEPHRINE (PF) 0.25% -1:200000 IJ SOLN
INTRAMUSCULAR | Status: AC
  Filled 2017-06-09: qty 30

## 2017-06-09 MED ORDER — FENTANYL CITRATE (PF) 100 MCG/2ML IJ SOLN
25.0000 ug | INTRAMUSCULAR | Status: DC | PRN
  Administered 2017-06-09 (×3): 50 ug via INTRAVENOUS

## 2017-06-09 MED ORDER — FENTANYL CITRATE (PF) 100 MCG/2ML IJ SOLN
100.0000 ug | Freq: Once | INTRAMUSCULAR | Status: AC
  Administered 2017-06-09: 100 ug via INTRAVENOUS

## 2017-06-09 MED ORDER — FENTANYL CITRATE (PF) 250 MCG/5ML IJ SOLN
INTRAMUSCULAR | Status: AC
  Filled 2017-06-09: qty 5

## 2017-06-09 MED ORDER — MIDAZOLAM HCL 2 MG/2ML IJ SOLN
2.0000 mg | Freq: Once | INTRAMUSCULAR | Status: AC
  Administered 2017-06-09: 2 mg via INTRAVENOUS

## 2017-06-09 MED ORDER — MIDAZOLAM HCL 2 MG/2ML IJ SOLN
INTRAMUSCULAR | Status: AC
  Filled 2017-06-09: qty 2

## 2017-06-09 MED ORDER — CLINDAMYCIN PHOSPHATE 600 MG/50ML IV SOLN
600.0000 mg | Freq: Four times a day (QID) | INTRAVENOUS | Status: AC
  Administered 2017-06-09 – 2017-06-10 (×3): 600 mg via INTRAVENOUS
  Filled 2017-06-09 (×4): qty 50

## 2017-06-09 MED ORDER — SODIUM CHLORIDE 0.9 % IR SOLN
Status: DC | PRN
  Administered 2017-06-09: 1000 mL

## 2017-06-09 MED ORDER — DEXAMETHASONE SODIUM PHOSPHATE 10 MG/ML IJ SOLN
INTRAMUSCULAR | Status: AC
  Filled 2017-06-09: qty 1

## 2017-06-09 MED ORDER — EPHEDRINE SULFATE-NACL 50-0.9 MG/10ML-% IV SOSY
PREFILLED_SYRINGE | INTRAVENOUS | Status: DC | PRN
  Administered 2017-06-09: 10 mg via INTRAVENOUS

## 2017-06-09 MED ORDER — FENTANYL CITRATE (PF) 100 MCG/2ML IJ SOLN
INTRAMUSCULAR | Status: AC
  Filled 2017-06-09: qty 2

## 2017-06-09 MED ORDER — SODIUM CHLORIDE 0.9 % IV SOLN
INTRAVENOUS | Status: DC
  Administered 2017-06-09: 22:00:00 via INTRAVENOUS

## 2017-06-09 MED ORDER — ONDANSETRON HCL 4 MG/2ML IJ SOLN
INTRAMUSCULAR | Status: AC
  Filled 2017-06-09: qty 2

## 2017-06-09 MED ORDER — THROMBIN 5000 UNITS EX SOLR
CUTANEOUS | Status: DC | PRN
  Administered 2017-06-09: 5000 [IU] via TOPICAL

## 2017-06-09 MED ORDER — ONDANSETRON HCL 4 MG/2ML IJ SOLN: 4.0000 mg | Freq: Once | INTRAMUSCULAR | Status: DC | PRN

## 2017-06-09 MED ORDER — ONDANSETRON HCL 4 MG PO TABS: 4.0000 mg | ORAL_TABLET | Freq: Four times a day (QID) | ORAL | Status: DC | PRN

## 2017-06-09 MED ORDER — ROPIVACAINE HCL 5 MG/ML IJ SOLN
INTRAMUSCULAR | Status: DC | PRN
  Administered 2017-06-09: 30 mL via PERINEURAL

## 2017-06-09 MED ORDER — ONDANSETRON HCL 4 MG/2ML IJ SOLN
INTRAMUSCULAR | Status: DC | PRN
  Administered 2017-06-09: 4 mg via INTRAVENOUS

## 2017-06-09 MED ORDER — DEXAMETHASONE SODIUM PHOSPHATE 10 MG/ML IJ SOLN
INTRAMUSCULAR | Status: DC | PRN
  Administered 2017-06-09: 10 mg via INTRAVENOUS

## 2017-06-09 MED ORDER — PROPOFOL 10 MG/ML IV BOLUS
INTRAVENOUS | Status: AC
Start: 2017-06-09 — End: ?
  Filled 2017-06-09: qty 20

## 2017-06-09 MED ORDER — SUGAMMADEX SODIUM 200 MG/2ML IV SOLN
INTRAVENOUS | Status: DC | PRN
  Administered 2017-06-09: 200 mg via INTRAVENOUS

## 2017-06-09 MED ORDER — DEXTROSE 5 % IV SOLN
INTRAVENOUS | Status: DC | PRN
  Administered 2017-06-09: 50 ug/min via INTRAVENOUS

## 2017-06-09 MED ORDER — FAMOTIDINE IN NACL 20-0.9 MG/50ML-% IV SOLN
20.0000 mg | Freq: Once | INTRAVENOUS | Status: AC
  Administered 2017-06-09: 20 mg via INTRAVENOUS

## 2017-06-09 MED ORDER — DOCUSATE SODIUM 100 MG PO CAPS
100.0000 mg | ORAL_CAPSULE | Freq: Two times a day (BID) | ORAL | Status: DC
  Administered 2017-06-09 – 2017-06-10 (×2): 100 mg via ORAL
  Filled 2017-06-09 (×2): qty 1

## 2017-06-09 MED ORDER — ROPIVACAINE HCL 2 MG/ML IJ SOLN
6.0000 mL/h | INTRAMUSCULAR | Status: DC
  Filled 2017-06-09: qty 200

## 2017-06-09 MED ORDER — PHENYLEPHRINE HCL 10 MG/ML IJ SOLN
INTRAMUSCULAR | Status: DC | PRN
  Administered 2017-06-09: 120 ug via INTRAVENOUS

## 2017-06-09 MED ORDER — BISACODYL 10 MG RE SUPP: 10.0000 mg | Freq: Every day | RECTAL | Status: DC | PRN

## 2017-06-09 MED ORDER — HEMOSTATIC AGENTS (NO CHARGE) OPTIME
TOPICAL | Status: DC | PRN
  Administered 2017-06-09: 1 via TOPICAL

## 2017-06-09 MED ORDER — BENAZEPRIL HCL 20 MG PO TABS
20.0000 mg | ORAL_TABLET | Freq: Every day | ORAL | Status: DC
  Administered 2017-06-10: 20 mg via ORAL
  Filled 2017-06-09: qty 1

## 2017-06-09 MED ORDER — METHOCARBAMOL 500 MG PO TABS: 500.0000 mg | ORAL_TABLET | Freq: Three times a day (TID) | ORAL | 1 refills | Status: DC | PRN

## 2017-06-09 MED ORDER — METOCLOPRAMIDE HCL 5 MG/ML IJ SOLN: 5.0000 mg | Freq: Three times a day (TID) | INTRAMUSCULAR | Status: DC | PRN

## 2017-06-09 MED ORDER — ROCURONIUM BROMIDE 100 MG/10ML IV SOLN
INTRAVENOUS | Status: DC | PRN
  Administered 2017-06-09: 40 mg via INTRAVENOUS

## 2017-06-09 MED ORDER — EPHEDRINE 5 MG/ML INJ
INTRAVENOUS | Status: AC
  Filled 2017-06-09: qty 10

## 2017-06-09 MED ORDER — ACETAMINOPHEN 325 MG PO TABS: 650.0000 mg | ORAL_TABLET | Freq: Four times a day (QID) | ORAL | Status: DC | PRN

## 2017-06-09 MED ORDER — ACETAMINOPHEN 650 MG RE SUPP: 650.0000 mg | Freq: Four times a day (QID) | RECTAL | Status: DC | PRN

## 2017-06-09 MED ORDER — CHLORHEXIDINE GLUCONATE 4 % EX LIQD: 60.0000 mL | Freq: Once | CUTANEOUS | Status: DC

## 2017-06-09 MED ORDER — METHOCARBAMOL 500 MG PO TABS
ORAL_TABLET | ORAL | Status: AC
  Filled 2017-06-09: qty 1

## 2017-06-09 MED ORDER — MENTHOL 3 MG MT LOZG: 1.0000 | LOZENGE | OROMUCOSAL | Status: DC | PRN

## 2017-06-09 MED ORDER — FENTANYL CITRATE (PF) 100 MCG/2ML IJ SOLN
INTRAMUSCULAR | Status: DC | PRN
  Administered 2017-06-09: 100 ug via INTRAVENOUS

## 2017-06-09 MED ORDER — THROMBIN 5000 UNITS EX SOLR
CUTANEOUS | Status: AC
  Filled 2017-06-09: qty 5000

## 2017-06-09 MED ORDER — ENOXAPARIN SODIUM 40 MG/0.4ML ~~LOC~~ SOLN: 40.0000 mg | SUBCUTANEOUS | Status: DC

## 2017-06-09 MED ORDER — BUPIVACAINE-EPINEPHRINE (PF) 0.5% -1:200000 IJ SOLN
INTRAMUSCULAR | Status: DC | PRN
  Administered 2017-06-09: 20 mL via PERINEURAL

## 2017-06-09 MED ORDER — METOCLOPRAMIDE HCL 5 MG PO TABS: 5.0000 mg | ORAL_TABLET | Freq: Three times a day (TID) | ORAL | Status: DC | PRN

## 2017-06-09 MED ORDER — METHOCARBAMOL 500 MG PO TABS
500.0000 mg | ORAL_TABLET | Freq: Four times a day (QID) | ORAL | Status: DC | PRN
  Administered 2017-06-09: 500 mg via ORAL

## 2017-06-09 MED ORDER — MIDAZOLAM HCL 2 MG/2ML IJ SOLN
INTRAMUSCULAR | Status: AC
  Administered 2017-06-09: 2 mg via INTRAVENOUS
  Filled 2017-06-09: qty 2

## 2017-06-09 MED ORDER — ROPIVACAINE HCL 2 MG/ML IJ SOLN
INTRAMUSCULAR | Status: DC | PRN
  Administered 2017-06-09: 6 mL/h via PERINEURAL

## 2017-06-09 MED ORDER — HYDROMORPHONE HCL 2 MG PO TABS: 2.0000 mg | ORAL_TABLET | ORAL | 0 refills | Status: DC | PRN

## 2017-06-09 MED ORDER — LACTATED RINGERS IV SOLN
INTRAVENOUS | Status: DC
  Administered 2017-06-09 (×2): via INTRAVENOUS

## 2017-06-09 MED ORDER — PROPOFOL 10 MG/ML IV BOLUS
INTRAVENOUS | Status: DC | PRN
  Administered 2017-06-09: 150 mg via INTRAVENOUS

## 2017-06-09 MED ORDER — HYDROMORPHONE HCL 1 MG/ML IJ SOLN
1.0000 mg | INTRAMUSCULAR | Status: DC | PRN
  Administered 2017-06-09: 1 mg via INTRAVENOUS

## 2017-06-09 MED ORDER — ALBUTEROL SULFATE HFA 108 (90 BASE) MCG/ACT IN AERS: 2.0000 | INHALATION_SPRAY | Freq: Four times a day (QID) | RESPIRATORY_TRACT | Status: DC | PRN

## 2017-06-09 MED ORDER — GLYCOPYRROLATE 0.2 MG/ML IJ SOLN
INTRAMUSCULAR | Status: DC | PRN
  Administered 2017-06-09: 0.2 mg via INTRAVENOUS

## 2017-06-09 SURGICAL SUPPLY — 75 items
BIT DRILL 5/64X5 DISP (BIT) ×3 IMPLANT
BLADE SAW SAG 73X25 THK (BLADE) ×2
BLADE SAW SGTL 73X25 THK (BLADE) ×1 IMPLANT
BNDG COHESIVE 3X5 TAN STRL LF (GAUZE/BANDAGES/DRESSINGS) ×3 IMPLANT
BUR SURG 4X8 MED (BURR) IMPLANT
BURR SURG 4MMX8MM MEDIUM (BURR)
BURR SURG 4X8 MED (BURR)
CAPT HIP TOTAL 2 ×3 IMPLANT
CEMENT HV SMART SET (Cement) ×3 IMPLANT
CLOSURE WOUND 1/2 X4 (GAUZE/BANDAGES/DRESSINGS) ×1
COVER SURGICAL LIGHT HANDLE (MISCELLANEOUS) ×3 IMPLANT
DRAPE IMP U-DRAPE 54X76 (DRAPES) ×3 IMPLANT
DRAPE INCISE IOBAN 66X45 STRL (DRAPES) ×6 IMPLANT
DRAPE ORTHO SPLIT 77X108 STRL (DRAPES) ×4
DRAPE SURG ORHT 6 SPLT 77X108 (DRAPES) ×2 IMPLANT
DRAPE U-SHAPE 47X51 STRL (DRAPES) ×3 IMPLANT
DRSG ADAPTIC 3X8 NADH LF (GAUZE/BANDAGES/DRESSINGS) ×3 IMPLANT
DRSG PAD ABDOMINAL 8X10 ST (GAUZE/BANDAGES/DRESSINGS) ×3 IMPLANT
DURAPREP 26ML APPLICATOR (WOUND CARE) ×3 IMPLANT
ELECT BLADE 4.0 EZ CLEAN MEGAD (MISCELLANEOUS) ×3
ELECT NEEDLE TIP 2.8 STRL (NEEDLE) ×3 IMPLANT
ELECT REM PT RETURN 9FT ADLT (ELECTROSURGICAL) ×3
ELECTRODE BLDE 4.0 EZ CLN MEGD (MISCELLANEOUS) ×1 IMPLANT
ELECTRODE REM PT RTRN 9FT ADLT (ELECTROSURGICAL) ×1 IMPLANT
GAUZE SPONGE 4X4 12PLY STRL (GAUZE/BANDAGES/DRESSINGS) ×3 IMPLANT
GLOVE BIOGEL PI ORTHO PRO 7.5 (GLOVE) ×2
GLOVE BIOGEL PI ORTHO PRO SZ7 (GLOVE) ×2
GLOVE BIOGEL PI ORTHO PRO SZ8 (GLOVE) ×2
GLOVE ORTHO TXT STRL SZ7.5 (GLOVE) ×3 IMPLANT
GLOVE PI ORTHO PRO STRL 7.5 (GLOVE) ×1 IMPLANT
GLOVE PI ORTHO PRO STRL SZ7 (GLOVE) ×1 IMPLANT
GLOVE PI ORTHO PRO STRL SZ8 (GLOVE) ×1 IMPLANT
GLOVE SURG ORTHO 8.5 STRL (GLOVE) ×6 IMPLANT
GOWN STRL REUS W/ TWL XL LVL3 (GOWN DISPOSABLE) ×3 IMPLANT
GOWN STRL REUS W/TWL XL LVL3 (GOWN DISPOSABLE) ×9
HANDPIECE INTERPULSE COAX TIP (DISPOSABLE)
KIT BASIN OR (CUSTOM PROCEDURE TRAY) ×3 IMPLANT
KIT ROOM TURNOVER OR (KITS) ×3 IMPLANT
MANIFOLD NEPTUNE II (INSTRUMENTS) ×3 IMPLANT
META PIN ×3 IMPLANT
NDL SUT 6 .5 CRC .975X.05 MAYO (NEEDLE) ×1 IMPLANT
NEEDLE 1/2 CIR MAYO (NEEDLE) ×3 IMPLANT
NEEDLE HYPO 25GX1X1/2 BEV (NEEDLE) ×3 IMPLANT
NEEDLE MAYO TAPER (NEEDLE) ×2
NS IRRIG 1000ML POUR BTL (IV SOLUTION) ×3 IMPLANT
PACK SHOULDER (CUSTOM PROCEDURE TRAY) ×3 IMPLANT
PAD ARMBOARD 7.5X6 YLW CONV (MISCELLANEOUS) ×6 IMPLANT
SET HNDPC FAN SPRY TIP SCT (DISPOSABLE) IMPLANT
SLING ARM IMMOBILIZER LRG (SOFTGOODS) ×3 IMPLANT
SLING ARM IMMOBILIZER MED (SOFTGOODS) IMPLANT
SMARTMIX MINI TOWER (MISCELLANEOUS) ×3
SPONGE LAP 18X18 X RAY DECT (DISPOSABLE) ×3 IMPLANT
SPONGE LAP 4X18 X RAY DECT (DISPOSABLE) ×3 IMPLANT
SPONGE SURGIFOAM ABS GEL SZ50 (HEMOSTASIS) IMPLANT
STRIP CLOSURE SKIN 1/2X4 (GAUZE/BANDAGES/DRESSINGS) ×2 IMPLANT
SUCTION FRAZIER HANDLE 10FR (MISCELLANEOUS) ×2
SUCTION TUBE FRAZIER 10FR DISP (MISCELLANEOUS) ×1 IMPLANT
SUT FIBERWIRE #2 38 T-5 BLUE (SUTURE) ×6
SUT MNCRL AB 4-0 PS2 18 (SUTURE) ×3 IMPLANT
SUT VIC AB 0 CT1 27 (SUTURE) ×2
SUT VIC AB 0 CT1 27XBRD ANBCTR (SUTURE) ×1 IMPLANT
SUT VIC AB 0 CT2 27 (SUTURE) ×3 IMPLANT
SUT VIC AB 2-0 CT1 27 (SUTURE) ×2
SUT VIC AB 2-0 CT1 TAPERPNT 27 (SUTURE) ×1 IMPLANT
SUT VICRYL AB 2 0 TIES (SUTURE) ×3 IMPLANT
SUTURE FIBERWR #2 38 T-5 BLUE (SUTURE) ×2 IMPLANT
SYR BULB IRRIGATION 50ML (SYRINGE) ×3 IMPLANT
SYR CONTROL 10ML LL (SYRINGE) ×3 IMPLANT
TAPE CLOTH SURG 6X10 WHT LF (GAUZE/BANDAGES/DRESSINGS) ×3 IMPLANT
TOWEL OR 17X24 6PK STRL BLUE (TOWEL DISPOSABLE) ×3 IMPLANT
TOWEL OR 17X26 10 PK STRL BLUE (TOWEL DISPOSABLE) ×3 IMPLANT
TOWER SMARTMIX MINI (MISCELLANEOUS) ×1 IMPLANT
TRAY FOLEY W/METER SILVER 16FR (SET/KITS/TRAYS/PACK) ×3 IMPLANT
WATER STERILE IRR 1000ML POUR (IV SOLUTION) ×3 IMPLANT
YANKAUER SUCT BULB TIP NO VENT (SUCTIONS) IMPLANT

## 2017-06-09 NOTE — Interval H&P Note (Signed)
History and Physical Interval Note:  06/09/2017 11:47 AM  [name]  has presented today for surgery, with the diagnosis of Left shoulder end-stage osteoarthritis   The various methods of treatment have been discussed with the patient and family. After consideration of risks, benefits and other options for treatment, the patient has consented to  Procedure(s): Left shoulder anatomic total shoulder arthroplasty (Left) as a surgical intervention .  The patient's history has been reviewed, patient examined, no change in status, stable for surgery.  I have reviewed the patient's chart and labs.  Questions were answered to the patient's satisfaction.     Uzoma Vivona,STEVEN R

## 2017-06-09 NOTE — Progress Notes (Signed)
Pt in pacu with tubing for block in place, Dr Maple Hudsonmoser to come in for futher eval as to what oto do with catheter

## 2017-06-09 NOTE — OR Nursing (Signed)
Dr. Maple HudsonMoser at bs and examined pt shoulder catheter, placement with ultrasound, and provided thorough explanation of further tx for pain. Medication administered in bolus dose per Dr. Maple HudsonMoser and ropovicaine initiated via IV at 6 ml/hr to continue until further ordered by anesthesia. Pt expresses relief and pain free.

## 2017-06-09 NOTE — Anesthesia Preprocedure Evaluation (Addendum)
Anesthesia Evaluation  Patient identified by MRN, date of birth, ID band Patient awake    Reviewed: Allergy & Precautions, NPO status , Patient's Chart, lab work & pertinent test results  Airway Mallampati: II  TM Distance: >3 FB Neck ROM: Full    Dental  (+) Edentulous Upper, Missing,    Pulmonary asthma , COPD (mild, controlled ),  COPD inhaler, former smoker,    Pulmonary exam normal breath sounds clear to auscultation       Cardiovascular hypertension, Pt. on medications + CAD  Normal cardiovascular exam  ECG: NSR, rate 90  ECG:  Prox RCA lesion, 50% stenosed.  Mid LAD lesion, 30% stenosed.  Mid Cx lesion, 10% stenosed.  The left ventricular systolic function is normal.  Sees cardiologist   Neuro/Psych Anxiety negative neurological ROS     GI/Hepatic negative GI ROS, Neg liver ROS,   Endo/Other  Hypothyroidism   Renal/GU negative Renal ROS  negative genitourinary   Musculoskeletal  (+) Arthritis , Osteoarthritis,  Fibromyalgia -  Abdominal   Peds negative pediatric ROS (+)  Hematology negative hematology ROS (+)   Anesthesia Other Findings Obese  Hyperlipidemia  PE  Reproductive/Obstetrics negative OB ROS                           Anesthesia Physical Anesthesia Plan  ASA: III  Anesthesia Plan: General   Post-op Pain Management:    Induction: Intravenous  PONV Risk Score and Plan: 3 and Ondansetron, Dexamethasone, Propofol and Midazolam  Airway Management Planned: Oral ETT  Additional Equipment:   Intra-op Plan:   Post-operative Plan: Extubation in OR  Informed Consent: I have reviewed the patients History and Physical, chart, labs and discussed the procedure including the risks, benefits and alternatives for the proposed anesthesia with the patient or authorized representative who has indicated his/her understanding and acceptance.   Dental advisory  given  Plan Discussed with: CRNA  Anesthesia Plan Comments:        Anesthesia Quick Evaluation

## 2017-06-09 NOTE — OR Nursing (Signed)
Instruction per Dr. Maple HudsonMoser to observe patient in PACU for 15 min after bolus and gtt initiated at 1735, then may tx to 5N 08.

## 2017-06-09 NOTE — Anesthesia Procedure Notes (Signed)
Anesthesia Regional Block: Interscalene brachial plexus block   Pre-Anesthetic Checklist: ,, timeout performed, Correct Patient, Correct Site, Correct Laterality, Correct Procedure,, site marked, risks and benefits discussed, Surgical consent,  Pre-op evaluation,  At surgeon's request and post-op pain management  Laterality: Left  Prep: chloraprep       Needles:  Injection technique: Catheter  Needle Type: Tuohy     Needle Length: 9cm  Needle Gauge: 19   Needle insertion depth: 8 cm  Catheter at skin depth: 13 cm  Additional Needles:   Procedures: ultrasound guided,,,,,,,,   Nerve Stimulator or Paresthesia:  Response: Quadriceps muscle contraction, 0.45 mA,   Additional Responses:   Narrative:  Start time: 06/09/2017 11:50 AM End time: 06/09/2017 12:10 PM Injection made incrementally with aspirations every 5 mL.  Performed by: Personally  Anesthesiologist: Karna ChristmasELLENDER, RYAN P  Additional Notes: Functioning IV was confirmed and monitors were applied.  A 90mm 19ga Tuohy echogenic stimulator needle was used. Sterile prep and drape,hand hygiene and sterile gloves were used.  Negative aspiration and negative test dose prior to incremental administration of local anesthetic. Catheter was tunneled posteriorly and position out of the surgical field.The patient tolerated the procedure well.

## 2017-06-09 NOTE — Anesthesia Procedure Notes (Signed)
Procedure Name: Intubation Date/Time: 06/09/2017 12:31 PM Performed by: Rush Farmer E Pre-anesthesia Checklist: Patient identified, Emergency Drugs available, Suction available and Patient being monitored Patient Re-evaluated:Patient Re-evaluated prior to inductionOxygen Delivery Method: Circle system utilized Preoxygenation: Pre-oxygenation with 100% oxygen Intubation Type: IV induction Ventilation: Mask ventilation without difficulty Laryngoscope Size: Mac and 3 Grade View: Grade I Tube type: Oral Tube size: 7.0 mm Number of attempts: 1 Airway Equipment and Method: Stylet Placement Confirmation: ETT inserted through vocal cords under direct vision,  positive ETCO2 and breath sounds checked- equal and bilateral Secured at: 21 cm Tube secured with: Tape Dental Injury: Teeth and Oropharynx as per pre-operative assessment

## 2017-06-09 NOTE — Transfer of Care (Signed)
Immediate Anesthesia Transfer of Care Note  Patient: [name]  Procedure(s) Performed: Procedure(s): Left shoulder anatomic total shoulder arthroplasty (Left)  Patient Location: PACU  Anesthesia Type:GA combined with regional for post-op pain  Level of Consciousness: awake, alert  and oriented  Airway & Oxygen Therapy: Patient Spontanous Breathing and Patient connected to nasal cannula oxygen  Post-op Assessment: Report given to RN, Post -op Vital signs reviewed and stable and Patient moving all extremities  Post vital signs: Reviewed and stable  Last Vitals:  Vitals:   06/09/17 1015  BP: (!) 143/88  Pulse: 93  Resp: 20  Temp: 36.9 C    Last Pain:  Vitals:   06/09/17 1015  TempSrc: Oral         Complications: No apparent anesthesia complications

## 2017-06-09 NOTE — Discharge Instructions (Signed)
Ice to the shoulder as much as possible.  Keep the sling on while up and around.  May remove the sling when seated, place pillow under or behind the left arm to keep arm across waist.  Keep the incision clean and dry and covered for one week, then ok to get it wet in the shower.  Do exercises every four hours  Follow up in two weeks in the office [phone]

## 2017-06-09 NOTE — Brief Op Note (Signed)
06/09/2017  2:40 PM  PATIENT:  [name]  61 y.o. female  PRE-OPERATIVE DIAGNOSIS:  Left shoulder end-stage osteoarthritis   POST-OPERATIVE DIAGNOSIS:  Left shoulder end-stage osteoarthritis  PROCEDURE:  Procedure(s): Left shoulder anatomic total shoulder arthroplasty (Left) [employer]  SURGEON:  Surgeon(s) and Role:    Beverely Low* Kadeen Sroka, MD - Primary  PHYSICIAN ASSISTANT:   ASSISTANTS: Thea Gisthomas B Dixon, PA-C   ANESTHESIA:   regional and general  EBL:  Total I/O In: 1000 [I.V.:1000] Out: 150 [Blood:150]  BLOOD ADMINISTERED:none  DRAINS: none   LOCAL MEDICATIONS USED:  NONE  SPECIMEN:  No Specimen  DISPOSITION OF SPECIMEN:  N/A  COUNTS:  YES  TOURNIQUET:  * No tourniquets in log *  DICTATION: .Other Dictation: Dictation Number [id number]  PLAN OF CARE: Admit to inpatient   PATIENT DISPOSITION:  PACU - hemodynamically stable.   Delay start of Pharmacological VTE agent (>24hrs) due to surgical blood loss or risk of bleeding: not applicable

## 2017-06-10 LAB — BASIC METABOLIC PANEL
ANION GAP: 6 (ref 5–15)
BUN: 15 mg/dL (ref 6–20)
CHLORIDE: 104 mmol/L (ref 101–111)
CO2: 25 mmol/L (ref 22–32)
Calcium: 8.4 mg/dL — ABNORMAL LOW (ref 8.9–10.3)
Creatinine, Ser: 0.77 mg/dL (ref 0.44–1.00)
GFR calc Af Amer: >60 mL/min (ref >60)
GLUCOSE: 185 mg/dL — AB (ref 65–99)
POTASSIUM: 4 mmol/L (ref 3.5–5.1)
Sodium: 135 mmol/L (ref 135–145)

## 2017-06-10 LAB — HEMOGLOBIN AND HEMATOCRIT, BLOOD
HEMATOCRIT: 36.5 % (ref 36.0–46.0)
Hemoglobin: 11.7 g/dL — ABNORMAL LOW (ref 12.0–15.0)

## 2017-06-10 MED ORDER — ENOXAPARIN SODIUM 40 MG/0.4ML ~~LOC~~ SOLN: 40.0000 mg | SUBCUTANEOUS | 0 refills | Status: DC

## 2017-06-10 NOTE — Progress Notes (Signed)
PT Cancellation Note  Patient Details Name: Sherry Ruffingngela H Keasling MRN: [id number] DOB: 11/22/1956   Cancelled Treatment:    Reason Eval/Treat Not Completed: PT screened, no needs identified, will sign off Per OT, pt has no acute PT needs. Please re-consult if needs change.  Gladys DammeBrittany Deshanta Lady, PT, DPT  Acute Rehabilitation Services  Pager: [phone]    Lehman PromBrittany S Tenya Araque 06/10/2017, 9:19 AM

## 2017-06-10 NOTE — Anesthesia Postprocedure Evaluation (Signed)
Anesthesia Post Note  Patient: [name]  Procedure(s) Performed: Procedure(s) (LRB): Left shoulder anatomic total shoulder arthroplasty (Left)     Patient location during evaluation: PACU Anesthesia Type: General and Regional Level of consciousness: awake and alert Pain management: pain level controlled Vital Signs Assessment: post-procedure vital signs reviewed and stable Respiratory status: spontaneous breathing, nonlabored ventilation, respiratory function stable and patient connected to nasal cannula oxygen Cardiovascular status: blood pressure returned to baseline and stable Postop Assessment: no signs of nausea or vomiting Anesthetic complications: no    Last Vitals:  Vitals:   06/10/17 0100 06/10/17 0354  BP: (!) 126/53 (!) 168/70  Pulse: 63 76  Resp:    Temp: 36.8 C 36.6 C    Last Pain:  Vitals:   06/10/17 0354  TempSrc: Oral  PainSc:                  Astra Gregg

## 2017-06-10 NOTE — Care Management Note (Signed)
Case Management Note  Patient Details  Name: [name] MRN: [id number] Date of Birth: 03/26/1956  Subjective/Objective:               Spoke with patient, [facility]. Referral made to Lupita Leashonna, [facility], [facility]. No other CM needs identified.      Action/Plan:  Will DC to home today w HH.  Expected Discharge Date:  06/10/17               Expected Discharge Plan:  Home w Home Health Services  In-House Referral:     Discharge planning Services  CM Consult  Post Acute Care Choice:    Choice offered to:  Patient  DME Arranged:    DME Agency:     HH Arranged:  PT, OT, Nurse's Aide [facility]  Status of Service:  Completed, signed off  If discussed at Long Length of Stay Meetings, dates discussed:    Additional Comments:  Lawerance SabalDebbie Hawthorne Day, RN 06/10/2017, 9:32 AM

## 2017-06-10 NOTE — Anesthesia Post-op Follow-up Note (Signed)
  Anesthesia Pain Follow-up Note  Patient: [name]  Day #: 2  Date of Follow-up: 06/10/2017 Time: 6:03 PM  Last Vitals:  Vitals:   06/10/17 0100 06/10/17 0354  BP: (!) 126/53 (!) 168/70  Pulse: 63 76  Resp:    Temp: 36.8 C 36.6 C    Level of Consciousness: alert  Pain: none   Side Effects:None  Catheter Site Exam:clean, dry  Anti-Coag Meds    Start     Dose/Rate Route Frequency Ordered Stop   06/10/17 0000  enoxaparin (LOVENOX) 40 MG/0.4ML injection     40 mg Subcutaneous Every 24 hours 06/10/17 0901         Plan: Patient to be discharged today. Infusion stopped and 10ml 0.5% Marcaine with epi bolused.  Catheter removed/tip intact at surgeon's request  [name]

## 2017-06-10 NOTE — Discharge Summary (Signed)
Physician Discharge Summary   Patient ID: [name] MRN: [id number] DOB/AGE: 19-Aug-1956 61 y.o.  Admit date: 06/09/2017 Discharge date: 06/10/2017  Admission Diagnoses:  Active Problems:   S/P shoulder replacement, left   Discharge Diagnoses:  Same   Surgeries: Procedure(s): Left shoulder anatomic total shoulder arthroplasty on 06/09/2017   Consultants: OT, PT  Discharged Condition: Stable  Hospital Course: [name] is an 61 y.o. female who was admitted 06/09/2017 with a chief complaint of left shoulder pain, and found to have a diagnosis of left shoulder arthritis, primary.  They were brought to the operating room on 06/09/2017 and underwent the above named procedures.    The patient had an uncomplicated hospital course and was stable for discharge.  Recent vital signs:  Vitals:   06/10/17 0100 06/10/17 0354  BP: (!) 126/53 (!) 168/70  Pulse: 63 76  Resp:    Temp: 98.3 F (36.8 C) 97.8 F (36.6 C)    Recent laboratory studies:  Results for orders placed or performed during the hospital encounter of 06/09/17  CBC  Result Value Ref Range   WBC 12.3 (H) 4.0 - 10.5 K/uL   RBC 4.47 3.87 - 5.11 MIL/uL   Hemoglobin 13.0 12.0 - 15.0 g/dL   HCT 40.9 81.1 - 91.4 %   MCV 91.7 78.0 - 100.0 fL   MCH 29.1 26.0 - 34.0 pg   MCHC 31.7 30.0 - 36.0 g/dL   RDW 78.2 95.6 - 21.3 %   Platelets 228 150 - 400 K/uL  Creatinine, serum  Result Value Ref Range   Creatinine, Ser 0.85 0.44 - 1.00 mg/dL   GFR calc non Af Amer >60 >60 mL/min   GFR calc Af Amer >60 >60 mL/min  Hemoglobin and hematocrit, blood  Result Value Ref Range   Hemoglobin 11.7 (L) 12.0 - 15.0 g/dL   HCT 08.6 57.8 - 46.9 %  Basic metabolic panel  Result Value Ref Range   Sodium 135 135 - 145 mmol/L   Potassium 4.0 3.5 - 5.1 mmol/L   Chloride 104 101 - 111 mmol/L   CO2 25 22 - 32 mmol/L   Glucose, Bld 185 (H) 65 - 99 mg/dL   BUN 15 6 - 20 mg/dL   Creatinine, Ser 6.29 0.44 - 1.00 mg/dL   Calcium  8.4 (L) 8.9 - 10.3 mg/dL   GFR calc non Af Amer >60 >60 mL/min   GFR calc Af Amer >60 >60 mL/min   Anion gap 6 5 - 15    Discharge Medications:   Allergies as of 06/10/2017      Reactions   Bee Venom Anaphylaxis   Cephalexin Anaphylaxis   Codeine Anaphylaxis, Shortness Of Breath, Rash   Flexeril [cyclobenzaprine] Shortness Of Breath, Swelling, Rash   Iodine Anaphylaxis   Latex Other (See Comments)   2nd and 3rd degree burn of skin   Lidocaine Anaphylaxis   Morphine Anaphylaxis   Neurontin [gabapentin] Anaphylaxis, Shortness Of Breath, Rash   Throat starts to close up and difficulty breathing   Oxycodone Anaphylaxis   Penicillins Anaphylaxis, Rash   PATIENT HAD A PCN REACTION WITH IMMEDIATE RASH, FACIAL/TONGUE/THROAT SWELLING, SOB, OR LIGHTHEADEDNESS WITH HYPOTENSION:  #  #  #  YES  #  #  #   Has patient had a PCN reaction causing severe rash involving mucus membranes or skin necrosis: No PATIENT HAS HAD A PCN REACTION THAT REQUIRED HOSPITALIZATION:  #  #  #  YES  #  #  #  PCN reaction occurring within the last 10 years: No   Wellbutrin [bupropion Hcl] Nausea And Vomiting, Other (See Comments)   BLACKOUTS > ? SYNCOPE ?   Gluten Diarrhea, Swelling   No wheat, abdomen swelling   Nutritional Supplements    UNSPECIFIED REACTION    Elavil [amitriptyline Hcl] Other (See Comments)   Hallucinations   Tobramycin Rash      Medication List    TAKE these medications   albuterol 108 (90 Base) MCG/ACT inhaler Commonly known as:  PROVENTIL HFA;VENTOLIN HFA Inhale 2 puffs into the lungs every 6 (six) hours as needed for wheezing or shortness of breath.   benazepril 20 MG tablet Commonly known as:  LOTENSIN Take 20 mg by mouth See admin instructions. Usually takes once daily but blood pressure has been running high and pt will take it twice daily as needed   enoxaparin 40 MG/0.4ML injection Commonly known as:  LOVENOX Inject 0.4 mLs (40 mg total) into the skin daily. 30 days post op    EPIPEN 2-PAK 0.3 mg/0.3 mL Soaj injection Generic drug:  EPINEPHrine Inject 0.3 mg into the muscle once.   HYDROcodone-acetaminophen 5-325 MG tablet Commonly known as:  NORCO/VICODIN Take 1 tablet by mouth every 4 (four) hours as needed for moderate pain.   HYDROmorphone 2 MG tablet Commonly known as:  DILAUDID Take 1 tablet (2 mg total) by mouth every 4 (four) hours as needed for severe pain.   methocarbamol 500 MG tablet Commonly known as:  ROBAXIN Take 500 mg by mouth every 6 (six) hours as needed for muscle spasms. What changed:  Another medication with the same name was added. Make sure you understand how and when to take each.   methocarbamol 500 MG tablet Commonly known as:  ROBAXIN Take 1 tablet (500 mg total) by mouth 3 (three) times daily as needed. What changed:  You were already taking a medication with the same name, and this prescription was added. Make sure you understand how and when to take each.       Diagnostic Studies: Dg Shoulder Left Port  Result Date: 06/09/2017 CLINICAL DATA:  Status post left shoulder arthroplasty EXAM: LEFT SHOULDER - 1 VIEW COMPARISON:  None. FINDINGS: Left shoulder arthroplasty changes noted. There is no evidence of acute fracture or dislocation. No complicating features are noted. IMPRESSION: Left shoulder arthroplasty without complicating features. Electronically Signed   By: Harmon PierJeffrey  Hu M.D.   On: 06/09/2017 15:55    Disposition: 01-Home or Self Care with home health PT, OT  Discharge Instructions    Call MD / Call 911    Complete by:  As directed    If you experience chest pain or shortness of breath, CALL 911 and be transported to the hospital emergency room.  If you develope a fever above 101 F, pus (white drainage) or increased drainage or redness at the wound, or calf pain, call your surgeon's office.   Constipation Prevention    Complete by:  As directed    Drink plenty of fluids.  Prune juice may be helpful.  You may use a  stool softener, such as Colace (over the counter) 100 mg twice a day.  Use MiraLax (over the counter) for constipation as needed.   Diet - low sodium heart healthy    Complete by:  As directed    Increase activity slowly as tolerated    Complete by:  As directed       Follow-up Information    Beverely Loworris, Katy Brickell,  MD. Call in 2 week(s).   Specialty:  Orthopedic Surgery Why:  [phone] Contact information: [address] [place] [address] [phone]            Signed: Verlee Rossetti 06/10/2017, 9:01 AM

## 2017-06-10 NOTE — Progress Notes (Signed)
Orthopedics Progress Note  Subjective: No pain this morning due to catheter  Objective:  Vitals:   06/10/17 0100 06/10/17 0354  BP: (!) 126/53 (!) 168/70  Pulse: 63 76  Resp:    Temp: 98.3 F (36.8 C) 97.8 F (36.6 C)    General: Awake and alert  Musculoskeletal: left shoulder dressing CDI, NVi distally but proximally weak due to block Neurovascularly intact  Lab Results  Component Value Date   WBC 12.3 (H) 06/09/2017   HGB 11.7 (L) 06/10/2017   HCT 36.5 06/10/2017   MCV 91.7 06/09/2017   PLT 228 06/09/2017       Component Value Date/Time   NA 135 06/10/2017 0332   K 4.0 06/10/2017 0332   CL 104 06/10/2017 0332   CO2 25 06/10/2017 0332   GLUCOSE 185 (H) 06/10/2017 0332   BUN 15 06/10/2017 0332   CREATININE 0.77 06/10/2017 0332   CALCIUM 8.4 (L) 06/10/2017 0332   GFRNONAA >60 06/10/2017 0332   GFRAA >60 06/10/2017 0332    Lab Results  Component Value Date   INR 1.01 05/13/2016   INR 0.87 08/17/2010   INR 0.9 07/11/2009    Assessment/Plan: POD #1 s/p Procedure(s): Left shoulder anatomic total shoulder arthroplasty Bolus catheter and then discharge home with home health therapy Lovenox for 30 days due to high risk for DVT - she has had in past  [name] SpareSteven R. Ranell PatrickNorris, MD 06/10/2017 9:04 AM

## 2017-06-10 NOTE — Evaluation (Signed)
Occupational Therapy Evaluation Patient Details Name: [name] MRN: [id number] DOB: Mar 15, 1956 Today's Date: 06/10/2017    History of Present Illness Pt is a 61 y/o female s/p Left shoulder anatomic total shoulder arthroplasty. Pt has a past medical history including Anxiety; Chronic back pain; COPD; Fibromyalgia; Gout; History of cardiac catheterization; History of pulmonary embolus; Hyperlipidemia; Hypothyroidism; Osteoarthritis; Prediabetes; and Stress incontinence. Pt has a past surgical history that includes Total knee arthroplasty; Total shoulder replacement; Abdominal hysterectomy; Cholecystectomy; Cervical disc surgery; Lumbar spine surgery; and Cardiac catheterization (05/13/2016).   Clinical Impression   PTA Pt independent in ADL and mobility. Pt currently mod A for ADL that require BUE. Pt modified independent for in room mobility with no DME at this time. Shoulder handout provided and reviewed adls in detail. Pt fully educated on shoulder precautions and compensatory strategies for shoulder replacement, always keep a shirt between skin and sling, sleep in sling, gentle ADL with LUE (hand to mouth) as well as HEP, Set alarm to take medicine in the middle of the night for pain management, correct bed positioning for sleeping, All education is complete and patient indicates understanding. At this time, OT recommending OT/PT in [facility] setting to maximize safety and independence in ADL and functional transfers for home safety assessment as Pt's husband will be gone at work during the day.       Follow Up Recommendations  Home health OT;Other (comment);Supervision - Intermittent (HHOT/PT for safety eval and HEP for LUE)    Equipment Recommendations  None recommended by OT    Recommendations for Other Services       Precautions / Restrictions Precautions Precautions: Shoulder Type of Shoulder Precautions: Conservative Protocol Shoulder Interventions: Shoulder sling/immobilizer;At  all times;Off for dressing/bathing/exercises Precaution Booklet Issued: Yes (comment) Precaution Comments: OT shoulder d/c handout reviewed in full Required Braces or Orthoses: Sling Restrictions Weight Bearing Restrictions: Yes LUE Weight Bearing: Non weight bearing Other Position/Activity Restrictions: gentle ADL's ok      Mobility Bed Mobility Overal bed mobility: Modified Independent             General bed mobility comments: increased time required- did not attempt to push or pull with LUE  Transfers Overall transfer level: Modified independent Equipment used: None             General transfer comment: NO attempts to push/pull with LUE    Balance Overall balance assessment: No apparent balance deficits (not formally assessed)                                         ADL either performed or assessed with clinical judgement   ADL Overall ADL's : Needs assistance/impaired                         Toilet Transfer: Supervision/safety;Ambulation;Comfort height toilet;Grab bars Toilet Transfer Details (indicate cue type and reason): able to manage IV pole and ambulate to bathroom with no problems Toileting- Clothing Manipulation and Hygiene: Supervision/safety;Sit to/from stand Toileting - Clothing Manipulation Details (indicate cue type and reason): hospital gown and peri care Tub/ Shower Transfer: Walk-in shower;Supervision/safety;With caregiver independent assisting;Ambulation Tub/Shower Transfer Details (indicate cue type and reason): discussed importance of having caregiver present for safety Functional mobility during ADLs: Modified independent General ADL Comments: Please see shoulder section below for further details     Vision Baseline Vision/History: Wears glasses Wears  Glasses: At all times Patient Visual Report: No change from baseline       Perception     Praxis      Pertinent Vitals/Pain Pain Assessment: No/denies  pain     Hand Dominance Right   Extremity/Trunk Assessment Upper Extremity Assessment Upper Extremity Assessment: LUE deficits/detail LUE Deficits / Details: s/p shoulder replacement; block still partially in place- some movement   Lower Extremity Assessment Lower Extremity Assessment: Overall WFL for tasks assessed   Cervical / Trunk Assessment Cervical / Trunk Assessment: Other exceptions Cervical / Trunk Exceptions: history of back surgery   Communication Communication Communication: No difficulties   Cognition Arousal/Alertness: Awake/alert Behavior During Therapy: WFL for tasks assessed/performed Overall Cognitive Status: Within Functional Limits for tasks assessed                                     General Comments  Pt ready to go home and concerned about her pharmacy closing    Exercises Exercises: Shoulder Shoulder Exercises Elbow Flexion: AROM;Left;10 reps;Standing Elbow Extension: AROM;Left;10 reps;Standing Wrist Flexion: AROM;Left;Seated Wrist Extension: AROM;Left;Seated Digit Composite Flexion: AROM;Left Composite Extension: AROM;Left Neck Flexion: AROM Neck Extension: AROM Neck Lateral Flexion - Right: AROM Neck Lateral Flexion - Left: AROM   Shoulder Instructions Shoulder Instructions Donning/doffing shirt without moving shoulder: Moderate assistance;Patient able to independently direct caregiver Method for sponge bathing under operated UE: Maximal assistance Donning/doffing sling/immobilizer: Maximal assistance;Patient able to independently direct caregiver Correct positioning of sling/immobilizer: Moderate assistance;Patient able to independently direct caregiver ROM for elbow, wrist and digits of operated UE: Modified independent Sling wearing schedule (on at all times/off for ADL's): Modified independent Proper positioning of operated UE when showering: Minimal assistance;Patient able to independently direct caregiver Positioning of  UE while sleeping: Minimal assistance;Patient able to independently direct caregiver    Home Living Family/patient expects to be discharged to:: Private residence Living Arrangements: Spouse/significant other Available Help at Discharge: Family;Available PRN/intermittently (husband works) Type of Home: House Home Access: Stairs to enter Secretary/administratorntrance Stairs-Number of Steps: 6 Entrance Stairs-Rails: Right;Left;Can reach both Home Layout: One level     Bathroom Shower/Tub: Producer, television/film/videoWalk-in shower   Bathroom Toilet: Handicapped height Bathroom Accessibility: Yes   Home Equipment: Bedside commode;Hand held Careers information officershower head;Walker - 4 wheels          Prior Functioning/Environment Level of Independence: Independent        Comments: driving        OT Problem List: Decreased strength;Decreased range of motion;Decreased knowledge of use of DME or AE;Obesity;Impaired UE functional use;Pain      OT Treatment/Interventions: Self-care/ADL training;Therapeutic exercise;DME and/or AE instruction;Therapeutic activities;Patient/family education    OT Goals(Current goals can be found in the care plan section) Acute Rehab OT Goals Patient Stated Goal: to get home OT Goal Formulation: With patient Time For Goal Achievement: 06/24/17 Potential to Achieve Goals: Good ADL Goals Pt Will Perform Upper Body Bathing: with modified independence;standing (using method taught by OT to keep shoulder safe) Pt Will Perform Upper Body Dressing: with min guard assist;with caregiver independent in assisting;sitting Pt/caregiver will Perform Home Exercise Program: Left upper extremity;With written HEP provided;Independently  OT Frequency: Min 2X/week   Barriers to D/C:            Co-evaluation              AM-PAC PT "6 Clicks" Daily Activity     Outcome Measure Help from another person eating  meals?: A Little Help from another person taking care of personal grooming?: A Little Help from another person  toileting, which includes using toliet, bedpan, or urinal?: None Help from another person bathing (including washing, rinsing, drying)?: A Little Help from another person to put on and taking off regular upper body clothing?: A Lot Help from another person to put on and taking off regular lower body clothing?: A Little 6 Click Score: 18   End of Session Equipment Utilized During Treatment: Other (comment);Gait belt (sling) Nurse Communication: Mobility status;Other (comment) (Pt does not want home pharmacy to close)  Activity Tolerance: Patient tolerated treatment well Patient left: in bed;with call bell/phone within reach  OT Visit Diagnosis: Pain Pain - Right/Left: Left Pain - part of body: Shoulder                Time: 1610-9604 OT Time Calculation (min): 38 min Charges:  OT General Charges $OT Visit: 1 Procedure OT Evaluation $OT Eval Moderate Complexity: 1 Procedure OT Treatments $Self Care/Home Management : 8-22 mins $Therapeutic Exercise: 8-22 mins G-Codes:     Sherryl Manges OTR/L [phone] Evern Bio Shritha Bresee 06/10/2017, 9:34 AM

## 2017-06-12 ENCOUNTER — Encounter (HOSPITAL_COMMUNITY): Payer: Self-pay | Admitting: Orthopedic Surgery

## 2017-06-12 NOTE — Op Note (Signed)
NAMESAVAYA, HAKES            ACCOUNT NO.:  [id number]  MEDICAL RECORD NO.:  [id number]  LOCATION:                                 FACILITY:  PHYSICIAN:  Almedia Balls. Ranell Patrick, M.D.      DATE OF BIRTH:  DATE OF PROCEDURE:  06/09/2017 DATE OF DISCHARGE:                              OPERATIVE REPORT   PREOPERATIVE DIAGNOSIS:  Left shoulder end-stage arthritis.  POSTOPERATIVE DIAGNOSIS:  Left shoulder end-stage arthritis.  PROCEDURE PERFORMED:  Left shoulder anatomic total shoulder arthroplasty using DePuy Global Unite system.  ATTENDING SURGEON:  Almedia Balls. Ranell Patrick, M.D.  ASSISTANT:  Konrad Felix Zarephath, [facility], who scrubbed the entire procedure and necessary for satisfactory completion of surgery.  ANESTHESIA:  General anesthesia was used plus interscalene block.  ESTIMATED BLOOD LOSS:  150 mL.  FLUID REPLACEMENT:  1500 mL of crystalloid.  INSTRUMENT COUNTS:  Correct.  COMPLICATIONS:  There were no complications.  PROPHYLAXIS:  Perioperative antibiotics were given.  INDICATIONS:  The patient is a 61 year old female with a history of worsening left shoulder pain and dysfunction secondary to end-stage arthritis of the left shoulder.  The patient has already had a shoulder replacement on the right and done well with that.  She presents with nearly disabling pain requiring narcotic pain medications, requesting shoulder replacement to relieve pain and restore function.  Informed consent was obtained.  DESCRIPTION OF PROCEDURE:  After an adequate level of anesthesia was achieved, the patient was positioned in a modified beach-chair position, left shoulder correctly identified, sterilely prepped and draped in usual manner.  Time-out was called verifying correct patient, correct site.  We entered the shoulder using a deltopectoral approach, starting at the coracoid process and extending down to the anterior humerus, dissection down through subcutaneous tissues using a Bovie.   We identified the cephalic vein, took it laterally to the deltoid, pectoralis taken medially.  Conjoint tendon identified and retracted medially.  We did an in-situ tenodesis of the biceps, incorporating that into the pectoralis tendon.  We then released the subscapularis subperiosteally off the lesser tuberosity and placed #2 FiberWire in a modified Mason-Allen suture technique into the free edge of the tendon for repair at the end of surgery.  Did a complete release of the inferior capsule off the humeral neck, progressively externally rotating.  We then placed a T-handle Crego elevator over the top of the humeral head just below the biceps tendon underneath the rotator cuff insertion.  We placed a large Crego elevator over the medial aspect of the head and then we externally rotated the elbow 20 degrees, placed our head resection guide up against the anterior humerus and then marked with the Bovie.  We then cut with an oscillating saw our head cut to the appropriate depth right to the level of the rotator cuff insertion. Next, we removed remaining osteophytes off the inferior humerus with a rongeur as we progressively externally rotated.  We then extended the shoulder, externally rotated and placed our brown retractor exposing the proximal humerus.  We entered the humeral canal with a 6-mm reamer and reamed up to a size 10 intramedullary canal diameter.  We then broached with the 8 and 10 for the  humeral stem and then impacted the real size 10 Global Unite humeral stem trial in place.  We then retrieved the trial.  We subluxed the humerus posteriorly.  Did a 360-degree capsule labral excision, getting back to the native glenoid.  This was a size 40 glenoid.  We drilled our central guide pin.  We reamed for the 40 glenoid.  Did our peripheral hand reamer with a T-handle reamer and then went ahead and drilled our superior hole and our 2 inferior holes for the anchor peg glenoid.  Placed  epi-soaked Gel-Foam in the peripheral holes and dried those.  We irrigated before we did that and then dried the entire shoulder.  We used high-viscosity cement by DePuy vacuum mixed and placed using a small syringe into the superior and 2 inferior holes leaving the central peg hole free of any cement.  We then selected the real 40 APG glenoid and impacted that in position, held until all cement was hard,.  Checked stability of our glenoid component after the cement was hardened and it was nice and stable.  We then irrigated thoroughly and then went ahead and did the final preparation of the humerus.  We placed. FiberWire suture x3 into the lesser tuberosity for repair of the subscapularis.  We then went ahead and impacted our real size 10 Global Unite stem in the appropriate version, 20 degrees retroversion, impacted that in position using available bone graft from the head using impaction grafting technique.  We had a very secure stem fit, which was a +2 cut.  We then went ahead and selected a 44 x 18 eccentric head trial and placed that with the centricity dialed superior and posteriorly.  We had excellent coverage of the cut surface of the humerus.  We reduced the shoulder and had nice stability.  We could translate anterior and posteriorly 50% of the head with head diameter and then also inferiorly as well.  We then removed the trial head and then selected a 44 x 18 real head ball and impacted that in position, again dialed superior and posteriorly.  Once that was in position and was secure, we reduced the shoulder.  We were pleased with our soft tissue balancing.  We then repaired the subscapularis anatomically with the suture back to bone through drill holes in the bone and also the rotator interval.  We had a nice anatomic repair.  We incorporated little to the biceps distally.  We ranged the shoulder at about 45 degrees of external rotation, no gapping, and very smooth motion up  to he above 90 degrees for elevation.  We irrigated thoroughly, closed the deltopectoral interval with 0 Vicryl suture followed by 2-0 Vicryl for subcutaneous closure and 4-0 Monocryl for skin.  Steri-Strips applied followed by sterile dressing.  The patient tolerated the surgery well.     Almedia BallsSteven R. Ranell PatrickNorris, M.D.   ______________________________ Almedia BallsSteven R. Ranell PatrickNorris, M.D.    SRN/MEDQ  D:  06/09/2017  T:  06/09/2017  Job:  [id number]

## 2017-06-28 ENCOUNTER — Other Ambulatory Visit (HOSPITAL_COMMUNITY): Payer: Self-pay | Admitting: Specialist

## 2017-06-28 ENCOUNTER — Ambulatory Visit (HOSPITAL_COMMUNITY)
Admission: RE | Admit: 2017-06-28 | Discharge: 2017-06-28 | Disposition: A | Discharge status: Home / Self Care | Payer: Medicare Other | Source: Ambulatory Visit | Attending: Cardiology | Admitting: Cardiology

## 2017-06-28 ENCOUNTER — Encounter (HOSPITAL_COMMUNITY): Payer: Self-pay | Admitting: *Deleted

## 2017-06-28 ENCOUNTER — Other Ambulatory Visit (HOSPITAL_COMMUNITY): Payer: Self-pay | Admitting: Orthopedic Surgery

## 2017-06-28 ENCOUNTER — Observation Stay (HOSPITAL_COMMUNITY)
Admission: EM | Admit: 2017-06-28 | Discharge: 2017-06-30 | Disposition: A | Discharge status: Home / Self Care | Payer: Medicare Other | Attending: Internal Medicine | Admitting: Internal Medicine

## 2017-06-28 ENCOUNTER — Other Ambulatory Visit: Payer: Self-pay

## 2017-06-28 DIAGNOSIS — Z91030 Bee allergy status: Secondary | ICD-10-CM | POA: Insufficient documentation

## 2017-06-28 DIAGNOSIS — F419 Anxiety disorder, unspecified: Secondary | ICD-10-CM | POA: Insufficient documentation

## 2017-06-28 DIAGNOSIS — Z86711 Personal history of pulmonary embolism: Secondary | ICD-10-CM | POA: Diagnosis not present

## 2017-06-28 DIAGNOSIS — Z79899 Other long term (current) drug therapy: Secondary | ICD-10-CM | POA: Insufficient documentation

## 2017-06-28 DIAGNOSIS — R079 Chest pain, unspecified: Secondary | ICD-10-CM | POA: Diagnosis not present

## 2017-06-28 DIAGNOSIS — M7989 Other specified soft tissue disorders: Principal | ICD-10-CM

## 2017-06-28 DIAGNOSIS — M109 Gout, unspecified: Secondary | ICD-10-CM | POA: Diagnosis not present

## 2017-06-28 DIAGNOSIS — E039 Hypothyroidism, unspecified: Secondary | ICD-10-CM | POA: Diagnosis not present

## 2017-06-28 DIAGNOSIS — E669 Obesity, unspecified: Secondary | ICD-10-CM | POA: Diagnosis present

## 2017-06-28 DIAGNOSIS — J441 Chronic obstructive pulmonary disease with (acute) exacerbation: Secondary | ICD-10-CM | POA: Insufficient documentation

## 2017-06-28 DIAGNOSIS — Z90710 Acquired absence of both cervix and uterus: Secondary | ICD-10-CM | POA: Insufficient documentation

## 2017-06-28 DIAGNOSIS — M1990 Unspecified osteoarthritis, unspecified site: Secondary | ICD-10-CM | POA: Diagnosis not present

## 2017-06-28 DIAGNOSIS — Z881 Allergy status to other antibiotic agents status: Secondary | ICD-10-CM | POA: Diagnosis not present

## 2017-06-28 DIAGNOSIS — G8929 Other chronic pain: Secondary | ICD-10-CM | POA: Diagnosis not present

## 2017-06-28 DIAGNOSIS — Z91018 Allergy to other foods: Secondary | ICD-10-CM | POA: Insufficient documentation

## 2017-06-28 DIAGNOSIS — R091 Pleurisy: Secondary | ICD-10-CM

## 2017-06-28 DIAGNOSIS — Z6833 Body mass index (BMI) 33.0-33.9, adult: Secondary | ICD-10-CM | POA: Diagnosis not present

## 2017-06-28 DIAGNOSIS — Z9049 Acquired absence of other specified parts of digestive tract: Secondary | ICD-10-CM | POA: Diagnosis not present

## 2017-06-28 DIAGNOSIS — Z87891 Personal history of nicotine dependence: Secondary | ICD-10-CM | POA: Diagnosis not present

## 2017-06-28 DIAGNOSIS — I10 Essential (primary) hypertension: Secondary | ICD-10-CM | POA: Diagnosis not present

## 2017-06-28 DIAGNOSIS — M79602 Pain in left arm: Secondary | ICD-10-CM

## 2017-06-28 DIAGNOSIS — Z96659 Presence of unspecified artificial knee joint: Secondary | ICD-10-CM | POA: Diagnosis not present

## 2017-06-28 DIAGNOSIS — Z96612 Presence of left artificial shoulder joint: Secondary | ICD-10-CM | POA: Diagnosis not present

## 2017-06-28 DIAGNOSIS — R072 Precordial pain: Secondary | ICD-10-CM

## 2017-06-28 DIAGNOSIS — Z880 Allergy status to penicillin: Secondary | ICD-10-CM | POA: Diagnosis not present

## 2017-06-28 DIAGNOSIS — M797 Fibromyalgia: Secondary | ICD-10-CM | POA: Diagnosis not present

## 2017-06-28 DIAGNOSIS — Z91040 Latex allergy status: Secondary | ICD-10-CM | POA: Diagnosis not present

## 2017-06-28 DIAGNOSIS — E785 Hyperlipidemia, unspecified: Secondary | ICD-10-CM | POA: Diagnosis not present

## 2017-06-28 DIAGNOSIS — Z720 Tobacco use: Secondary | ICD-10-CM | POA: Diagnosis present

## 2017-06-28 DIAGNOSIS — R7303 Prediabetes: Secondary | ICD-10-CM | POA: Diagnosis not present

## 2017-06-28 DIAGNOSIS — Z888 Allergy status to other drugs, medicaments and biological substances status: Secondary | ICD-10-CM | POA: Insufficient documentation

## 2017-06-28 HISTORY — DX: Unspecified asthma, uncomplicated: J45.909

## 2017-06-28 NOTE — ED Triage Notes (Signed)
Pt c/o left side chest pain that started yesterday states that the pain feels the same as when she had her PE with her last surgery, pt is 185 days post op from left shoulder replacement,

## 2017-06-29 ENCOUNTER — Emergency Department (HOSPITAL_COMMUNITY): Payer: Medicare Other

## 2017-06-29 ENCOUNTER — Observation Stay (HOSPITAL_COMMUNITY): Payer: Medicare Other

## 2017-06-29 ENCOUNTER — Encounter (HOSPITAL_COMMUNITY): Payer: Self-pay

## 2017-06-29 DIAGNOSIS — R071 Chest pain on breathing: Secondary | ICD-10-CM | POA: Diagnosis not present

## 2017-06-29 DIAGNOSIS — R079 Chest pain, unspecified: Secondary | ICD-10-CM | POA: Diagnosis not present

## 2017-06-29 LAB — BASIC METABOLIC PANEL
ANION GAP: 10 (ref 5–15)
BUN: 19 mg/dL (ref 6–20)
CHLORIDE: 104 mmol/L (ref 101–111)
CO2: 27 mmol/L (ref 22–32)
Calcium: 9.3 mg/dL (ref 8.9–10.3)
Creatinine, Ser: 0.66 mg/dL (ref 0.44–1.00)
GFR calc non Af Amer: >60 mL/min (ref >60)
Glucose, Bld: 101 mg/dL — ABNORMAL HIGH (ref 65–99)
Potassium: 4.3 mmol/L (ref 3.5–5.1)
Sodium: 141 mmol/L (ref 135–145)

## 2017-06-29 LAB — CBC WITH DIFFERENTIAL/PLATELET
BASOS ABS: 0 10*3/uL (ref 0.0–0.1)
BASOS PCT: 0 %
Eosinophils Absolute: 0.1 10*3/uL (ref 0.0–0.7)
Eosinophils Relative: 1 %
HEMATOCRIT: 41.3 % (ref 36.0–46.0)
HEMOGLOBIN: 13.8 g/dL (ref 12.0–15.0)
Lymphocytes Relative: 28 %
Lymphs Abs: 2.4 10*3/uL (ref 0.7–4.0)
MCH: 30.5 pg (ref 26.0–34.0)
MCHC: 33.4 g/dL (ref 30.0–36.0)
MCV: 91.4 fL (ref 78.0–100.0)
Monocytes Absolute: 0.6 10*3/uL (ref 0.1–1.0)
Monocytes Relative: 7 %
NEUTROS ABS: 5.5 10*3/uL (ref 1.7–7.7)
NEUTROS PCT: 64 %
Platelets: 289 10*3/uL (ref 150–400)
RBC: 4.52 MIL/uL (ref 3.87–5.11)
RDW: 14.4 % (ref 11.5–15.5)
WBC: 8.5 10*3/uL (ref 4.0–10.5)

## 2017-06-29 LAB — MRSA PCR SCREENING: MRSA by PCR: NEGATIVE

## 2017-06-29 LAB — PROTIME-INR
INR: 0.9
PROTHROMBIN TIME: 12.1 s (ref 11.4–15.2)

## 2017-06-29 LAB — TROPONIN I: Troponin I: <0.03 ng/mL (ref <0.03)

## 2017-06-29 LAB — I-STAT TROPONIN, ED: TROPONIN I, POC: 0 ng/mL (ref 0.00–0.08)

## 2017-06-29 LAB — D-DIMER, QUANTITATIVE (NOT AT ARMC): D DIMER QUANT: 1.33 ug{FEU}/mL — AB (ref 0.00–0.50)

## 2017-06-29 MED ORDER — TECHNETIUM TC 99M DIETHYLENETRIAME-PENTAACETIC ACID
30.0000 | Freq: Once | INTRAVENOUS | Status: AC | PRN
  Administered 2017-06-29: 33 via RESPIRATORY_TRACT

## 2017-06-29 MED ORDER — BENAZEPRIL HCL 10 MG PO TABS
20.0000 mg | ORAL_TABLET | Freq: Every day | ORAL | Status: DC
  Administered 2017-06-29 – 2017-06-30 (×2): 20 mg via ORAL
  Filled 2017-06-29 (×2): qty 1
  Filled 2017-06-29 (×2): qty 2

## 2017-06-29 MED ORDER — ALBUTEROL SULFATE (2.5 MG/3ML) 0.083% IN NEBU
2.5000 mg | INHALATION_SOLUTION | Freq: Four times a day (QID) | RESPIRATORY_TRACT | Status: DC | PRN
Start: 2017-06-29 — End: 2017-06-30

## 2017-06-29 MED ORDER — TECHNETIUM TO 99M ALBUMIN AGGREGATED
4.0000 | Freq: Once | INTRAVENOUS | Status: AC | PRN
  Administered 2017-06-29: 4.4 via INTRAVENOUS

## 2017-06-29 MED ORDER — PROMETHAZINE HCL 12.5 MG PO TABS
50.0000 mg | ORAL_TABLET | Freq: Once | ORAL | Status: AC
  Administered 2017-06-29: 50 mg via ORAL
  Filled 2017-06-29: qty 4

## 2017-06-29 MED ORDER — ALBUTEROL SULFATE HFA 108 (90 BASE) MCG/ACT IN AERS
2.0000 | INHALATION_SPRAY | Freq: Four times a day (QID) | RESPIRATORY_TRACT | Status: DC | PRN
  Filled 2017-06-29: qty 6.7

## 2017-06-29 MED ORDER — METHOCARBAMOL 500 MG PO TABS
500.0000 mg | ORAL_TABLET | Freq: Four times a day (QID) | ORAL | Status: DC
  Administered 2017-06-29 – 2017-06-30 (×5): 500 mg via ORAL
  Filled 2017-06-29 (×5): qty 1

## 2017-06-29 MED ORDER — ENOXAPARIN SODIUM 40 MG/0.4ML ~~LOC~~ SOLN
40.0000 mg | SUBCUTANEOUS | Status: DC
  Administered 2017-06-29: 40 mg via SUBCUTANEOUS
  Filled 2017-06-29: qty 0.4

## 2017-06-29 MED ORDER — METHOCARBAMOL 500 MG PO TABS: 500.0000 mg | ORAL_TABLET | Freq: Three times a day (TID) | ORAL | Status: DC | PRN

## 2017-06-29 MED ORDER — ENOXAPARIN SODIUM 120 MG/0.8ML ~~LOC~~ SOLN
1.0000 mg/kg | Freq: Two times a day (BID) | SUBCUTANEOUS | Status: DC
  Administered 2017-06-29: 105 mg via SUBCUTANEOUS
  Filled 2017-06-29 (×4): qty 0.8

## 2017-06-29 MED ORDER — HYDROMORPHONE HCL 2 MG PO TABS
2.0000 mg | ORAL_TABLET | ORAL | Status: DC | PRN
  Filled 2017-06-29: qty 1

## 2017-06-29 MED ORDER — PROMETHAZINE HCL 12.5 MG PO TABS
50.0000 mg | ORAL_TABLET | Freq: Four times a day (QID) | ORAL | Status: DC | PRN
  Administered 2017-06-29 – 2017-06-30 (×5): 50 mg via ORAL
  Filled 2017-06-29 (×5): qty 4

## 2017-06-29 MED ORDER — MEPERIDINE HCL 50 MG PO TABS
50.0000 mg | ORAL_TABLET | Freq: Four times a day (QID) | ORAL | Status: DC | PRN
  Administered 2017-06-29 – 2017-06-30 (×5): 50 mg via ORAL
  Filled 2017-06-29 (×5): qty 1

## 2017-06-29 MED ORDER — MEPERIDINE HCL 50 MG PO TABS
50.0000 mg | ORAL_TABLET | Freq: Once | ORAL | Status: AC
  Administered 2017-06-29: 50 mg via ORAL
  Filled 2017-06-29: qty 1

## 2017-06-29 NOTE — ED Notes (Signed)
Patient transported to X-ray 

## 2017-06-29 NOTE — ED Notes (Addendum)
Pt requesting pain medication. I went into pts room with ordered pain med dilaudid. Pt became upset because it is not her normal demerol 50 mg po. Spoke with Dr Ella JubileeArrien who gave order to resume home pain meds of demoral 50 mg and robaxin 500 mg

## 2017-06-29 NOTE — ED Notes (Signed)
Dr Le at bedside,  

## 2017-06-29 NOTE — ED Notes (Signed)
ED Provider at bedside. 

## 2017-06-29 NOTE — Progress Notes (Signed)
Progress Note    [name]  ZOX:[id number] DOB: 04/29/1956  DOA: 06/28/2017 PCP: Patient, No Pcp Per    Brief Narrative:   Chief complaint: Follow-up chest pain  Medical records reviewed and are as summarized below:  [name] is an 61 y.o. female obesity with a BMI of approximately 65, prior pulmonary embolism, recent left shoulder surgery, hypertension, anxiety, and COPD who was admitted 06/28/17 the chief complaint of a 2 day history of retrosternal chest pain. EKG done on admission was negative for ischemic changes. Troponin was not elevated. Chest x-ray was unremarkable. D-dimer was elevated at 1.33. She was admitted to rule out pulmonary embolism.  Assessment/Plan:   Principal Problem:   Chest pain in adult Placed on therapeutic dose Lovenox on admission until PE was effectively ruled out. VQ scan: Normal ventilation and perfusion with no evidence of PE. Discontinue therapeutic dose Lovenox and switch to of prophylactic dose. Cycle troponins. EKG personally reviewed, no ischemic findings but did have poor R-wave progression. Patient had a cardiac catheterization 05/2016 which showed nonobstructive coronary disease.  Active Problems:   Tobacco abuse Counseled.    S/P shoulder replacement, left Sling in place.    HIV screening The patient falls between the ages of 13-64 and should be screened for HIV, therefore HIV testing ordered.    Morbid Obesity Body mass index is 33.97 kg/m.   Family Communication/Anticipated D/C date and plan/Code Status   DVT prophylaxis: Lovenox ordered. Code Status: Full Code.  Family Communication: No family at bedside. Disposition Plan: Home in 24 hours if no pulmonary embolism found and no other serious cause of chest pain found.   Medical Consultants:    None.   Anti-Infectives:    None  Subjective:   The patient reports having had chest pain similar to when she had a pulmonary embolism in the past. She has  had some swelling and pain in the left arm, but denied any recent swelling to the lower extremities or calf tenderness. No shortness of breath.  Objective:    Vitals:   06/28/17 2338 06/29/17 0226 06/29/17 0736  BP: (!) 156/94 (!) 152/68 (!) 154/81  Pulse: 88 79 73  Resp: 20 16 18   Temp: 98.2 F (36.8 C)  97.9 F (36.6 C)  TempSrc: Oral  Oral  SpO2: 95% 97% 95%  Weight: 104.3 kg (230 lb)    Height: 5\' 9"  (1.753 m)     No intake or output data in the 24 hours ending 06/29/17 0855 Filed Weights   06/28/17 2338  Weight: 104.3 kg (230 lb)    Exam: General exam: Obese female in no acute distress. Respiratory system: Lungs clear to auscultation bilaterally with good air movement. No wheezes or rales. Cardiovascular system: Heart sounds are regular with a normal rate. No murmurs, rubs, or gallops. Gastrointestinal system: Abdomen is soft, nontender, nondistended with normal active bowel sounds. Central nervous system: Alert, nonfocal. Extremities: No clubbing, edema, or cyanosis. Left arm in sling with some scattered ecchymosis. Skin: No rashes, left arm/shoulder ecchymosis. Psychiatry: Mood and affect normal. Insight/judgment appear normal.  Data Reviewed:   I have personally reviewed following labs and imaging studies:  Labs: Labs show the following: Sodium 141, potassium 4.3, chloride 104, bicarbonate 27, BUN 19, creatinine 0.66, glucose 101. INR 0.9. WBC 8.5, and the globe and 13.8, hematocrit 41.3, platelets 289. D-dimer 1.33.  Pending labs: HIV antibody   Procedures and diagnostic studies:  Dg Chest 2 View  06/29/2017:  Personally reviewed. Lung fields appear normal. Heart normal sized. Bilateral shoulder prosthesis visible.    VQ scan 06/29/17: Normal ventilation and perfusion.  Medications:   . benazepril  20 mg Oral Daily  . enoxaparin (LOVENOX) injection  1 mg/kg Subcutaneous Q12H  . methocarbamol  500 mg Oral Q6H   Continuous Infusions:  No  charge.  Problems/DDx Points   Self limited or minor (max 2)       1   Established problem, stable       1   Established problem, worsening       2   New problem, no additional W/U planned (max 1)       3   New problem, additional W/U planned        4    Data Reviewed Points   Review/order clinical lab tests       1   Review/order x-rays       1   Review/order tests (Echo, EKG, PFTs, etc)       1   Discussion of test results w/ performing MD       1   Independent review of image, tracing or specimen       2   Decision to obtain old records       1   Review and summation of old records       2    Level of risk Presenting prob Diagnostics Management   Minimal 1 self limited/minor Labs CXR EKG/EEG U/A U/S Rest Gargles Bandages Dressings   Low 2 or more self limited/minor 1 stable chronic Acute uncomplicated illness Tests (PFTS) Non-CV imaging Arterial labs Biopsies of skin OTC drugs Minor surgery-no risk PT OT IVF without additives    Moderate 1 or more chronic illnesses w/ mild exac, progression or S/E from tx 2 or more stable chronic illnesses Undiagnosed new problem w/ uncertain prognosis Acute complicated injury  Stress tests Endoscopies with no risk factors Deep needle or incisional bx CV imaging without risk LP Thoracentesis Paracentesis Minor surgery w/ risks Elective major surgery w/ no risk (open, percutaneous or endoscopic) Prescription drugs Therapeutic nucl med IVF with additives Closed tx of fracture/dislocation    High Severe exac of chronic illness Acute or chronic illness/injury may pose a threat to life or bodily function (ARF) Change in neuro status    CV imaging w/ contrast and risk Cardio electophysiologic tests Endoscopies w/ risk Discography Elective major surgery Emergency major surgery Parenteral controlled substances Drug therapy req monitoring for toxicity DNR/de-escalation of care    MDM Prob points Data points Risk    Straightforward    <1    <1    Min   Low complexity    2    2    Low   Moderate    3    3    Mod   High Complexity    4 or more    4 or more    High      LOS: 0 days   RAMA,CHRISTINA  [facility] Pager [phone]. If unable to reach me by pager, please call my cell phone at [phone].  *Please refer to [email or web address], password [email or web address] to get updated schedule on who will round on this patient, as hospitalists switch teams weekly. If 7PM-7AM, please contact night-coverage at [email or web address], password [email or web address] for any overnight needs.  06/29/2017, 8:55 AM

## 2017-06-29 NOTE — ED Notes (Signed)
Pt returned from xray,  

## 2017-06-29 NOTE — ED Notes (Signed)
Pt ambulated to BR with assistance.

## 2017-06-29 NOTE — H&P (Signed)
History and Physical    [name]:[id number] DOB: 1956/12/07 DOA: 06/28/2017  PCP: Patient, No Pcp Per  Patient coming from: Home.    Chief Complain:  Retrosternal chest pain for the past 2 days.   HPI: [name] is an 61 y.o. female with hx of obesity, prior hx of PE, recent left shoulder surgery, HTN, anxiety, COPD presented to the ER with 2 days hx of retrosternal chest pain, similar to symptom she had with previous PE.  She denied nausea, vomting or SOB.  She saw her orthoepedic surgeon today, and was having doppler of her left upper arm, but it was still painful, so she couldn't tolerate it.  Work up in the ER included an EKG with no acute ST T changes, negaitve troponin, and negaitve CXR.  Her D dimer was elevated at 1.33.  She has a normal WBC and Hb. Her Cr was normal but she has several allergies, including anaphylaxis with contrast dye.  Hospitalist was asked to admit her to r/out a PE.   ED Course:  See above.  Rewiew of Systems:  Constitutional: Negative for malaise, fever and chills. No significant weight loss or weight gain Eyes: Negative for eye pain, redness and discharge, diplopia, visual changes, or flashes of light. ENMT: Negative for ear pain, hoarseness, nasal congestion, sinus pressure and sore throat. No headaches; tinnitus, drooling, or problem swallowing. Cardiovascular: Negative for palpitations, diaphoresis, dyspnea and peripheral edema. ; No orthopnea, PND Respiratory: Negative for cough, hemoptysis, wheezing and stridor. No pleuritic chestpain. Gastrointestinal: Negative for diarrhea, constipation,  melena, blood in stool, hematemesis, jaundice and rectal bleeding.    Genitourinary: Negative for frequency, dysuria, incontinence,flank pain and hematuria; Musculoskeletal: Negative for back pain and neck pain. Negative for swelling and trauma.;  Skin: . Negative for pruritus, rash, abrasions, bruising and skin lesion.; ulcerations Neuro: Negative  for headache, lightheadedness and neck stiffness. Negative for weakness, altered level of consciousness , altered mental status, extremity weakness, burning feet, involuntary movement, seizure and syncope.  Psych: negative for anxiety, depression, insomnia, tearfulness, panic attacks, hallucinations, paranoia, suicidal or homicidal ideation    Past Medical History:  Diagnosis Date  . Anxiety   . Chronic back pain   . COPD (chronic obstructive pulmonary disease) (HCC)   . Essential hypertension   . Fibromyalgia   . Gout   . History of cardiac catheterization    [place] - recalls having "70%" stenosis in an artery  . History of colonic polyps   . History of pulmonary embolus (PE)    Postoperative 2011  . Hyperlipidemia   . Hypothyroidism    patient denies - not on medicaion for that   . Osteoarthritis   . Prediabetes    patient denies  . Stress incontinence    Past Surgical History:  Procedure Laterality Date  . ABDOMINAL HYSTERECTOMY    . CARDIAC CATHETERIZATION N/A 05/13/2016   Procedure: Left Heart Cath and Coronary Angiography;  Surgeon: Corky Crafts, MD;  Location: [facility] INVASIVE CV LAB;  Service: Cardiovascular;  Laterality: N/A;  . CERVICAL DISC SURGERY    . CHOLECYSTECTOMY    . LUMBAR SPINE SURGERY    . TOTAL KNEE ARTHROPLASTY    . TOTAL SHOULDER ARTHROPLASTY Left 06/09/2017   Procedure: Left shoulder anatomic total shoulder arthroplasty;  Surgeon: Beverely Low, MD;  Location[facility] OR;  Service: Orthopedics;  Laterality: Left;  . TOTAL SHOULDER REPLACEMENT       reports that she quit smoking about 16  months ago. Her smoking use included Cigarettes. She has a 15.00 pack-year smoking history. She has never used smokeless tobacco. She reports that she does not drink alcohol or use drugs.  Allergies  Allergen Reactions  . Bee Venom Anaphylaxis  . Cephalexin Anaphylaxis  . Codeine Anaphylaxis, Shortness Of Breath and Rash  . Flexeril [Cyclobenzaprine] Shortness Of  Breath, Swelling and Rash  . Iodine Anaphylaxis  . Latex Other (See Comments)    2nd and 3rd degree burn of skin  . Lidocaine Anaphylaxis    sob  . Morphine Anaphylaxis  . Neurontin [Gabapentin] Anaphylaxis, Shortness Of Breath and Rash    Throat starts to close up and difficulty breathing  . Oxycodone Anaphylaxis  . Penicillins Anaphylaxis and Rash    PATIENT HAD A PCN REACTION WITH IMMEDIATE RASH, FACIAL/TONGUE/THROAT SWELLING, SOB, OR LIGHTHEADEDNESS WITH HYPOTENSION:  #  #  #  YES  #  #  #   Has patient had a PCN reaction causing severe rash involving mucus membranes or skin necrosis: No PATIENT HAS HAD A PCN REACTION THAT REQUIRED HOSPITALIZATION:  #  #  #  YES  #  #  #   PCN reaction occurring within the last 10 years: No   . Wellbutrin [Bupropion Hcl] Nausea And Vomiting and Other (See Comments)    BLACKOUTS > ? SYNCOPE ?  . Gluten Diarrhea and Swelling    No wheat, abdomen swelling  . Nutritional Supplements     UNSPECIFIED REACTION   . Elavil [Amitriptyline Hcl] Other (See Comments)    Hallucinations  . Tobramycin Rash    Family History  Problem Relation Age of Onset  . Colon cancer Maternal Aunt   . Lung disease Father      Prior to Admission medications   Medication Sig Start Date End Date Taking? Authorizing Provider  albuterol (PROVENTIL HFA;VENTOLIN HFA) 108 (90 BASE) MCG/ACT inhaler Inhale 2 puffs into the lungs every 6 (six) hours as needed for wheezing or shortness of breath.    Yes [provider]  benazepril (LOTENSIN) 20 MG tablet Take 20 mg by mouth See admin instructions. Usually takes once daily but blood pressure has been running high and pt will take it twice daily as needed 02/06/16  Yes [provider]  enoxaparin (LOVENOX) 40 MG/0.4ML injection Inject 0.4 mLs (40 mg total) into the skin daily. 30 days post op 06/10/17  Yes Beverely Low, MD  EPINEPHrine (EPIPEN 2-PAK) 0.3 mg/0.3 mL IJ SOAJ injection Inject 0.3 mg into the muscle once.    Yes [provider]  meperidine (DEMEROL) 50 MG tablet Take 50 mg by mouth every 6 (six) hours as needed for severe pain.   Yes [provider]  methocarbamol (ROBAXIN) 500 MG tablet Take 1 tablet (500 mg total) by mouth 3 (three) times daily as needed. Patient taking differently: Take 500 mg by mouth every 6 (six) hours as needed.  06/09/17  Yes Beverely Low, MD  promethazine (PHENERGAN) 50 MG tablet Take 50 mg by mouth every 6 (six) hours as needed for nausea or vomiting.   Yes [provider]  HYDROcodone-acetaminophen (NORCO/VICODIN) 5-325 MG tablet Take 1 tablet by mouth every 4 (four) hours as needed for moderate pain.    [provider]  HYDROmorphone (DILAUDID) 2 MG tablet Take 1 tablet (2 mg total) by mouth every 4 (four) hours as needed for severe pain. 06/09/17   Beverely Low, MD  methocarbamol (ROBAXIN) 500 MG tablet Take 500 mg by  mouth every 6 (six) hours as needed for muscle spasms.    [provider]    Physical Exam: Vitals:   06/28/17 2338  BP: (!) 156/94  Pulse: 88  Resp: 20  Temp: 98.2 F (36.8 C)  TempSrc: Oral  SpO2: 95%  Weight: 104.3 kg (230 lb)  Height: 5\' 9"  (1.753 m)      Constitutional: NAD, calm, comfortable Vitals:   06/28/17 2338  BP: (!) 156/94  Pulse: 88  Resp: 20  Temp: 98.2 F (36.8 C)  TempSrc: Oral  SpO2: 95%  Weight: 104.3 kg (230 lb)  Height: 5\' 9"  (1.753 m)   Eyes: PERRL, lids and conjunctivae normal ENMT: Mucous membranes are moist. Posterior pharynx clear of any exudate or lesions.Normal dentition.  Neck: normal, supple, no masses, no thyromegaly Respiratory: clear to auscultation bilaterally, no wheezing, no crackles. Normal respiratory effort. No accessory muscle use.  Cardiovascular: Regular rate and rhythm, no murmurs / rubs / gallops. No extremity edema. 2+ pedal pulses. No carotid bruits.  Abdomen: no tenderness, no masses palpated. No hepatosplenomegaly. Bowel sounds positive.    Musculoskeletal: no clubbing / cyanosis. No joint deformity upper and lower extremities. Good ROM, no contractures. Normal muscle tone.  Skin: no rashes, lesions, ulcers. No induration Neurologic: CN 2-12 grossly intact. Sensation intact, DTR normal. Strength 5/5 in all 4.  Psychiatric: Normal judgment and insight. Alert and oriented x 3. Normal mood.    Labs on Admission: I have personally reviewed following labs and imaging studies CBC:  Recent Labs Lab 06/28/17 2355  WBC 8.5  NEUTROABS 5.5  HGB 13.8  HCT 41.3  MCV 91.4  PLT 289   Basic Metabolic Panel:  Recent Labs Lab 06/28/17 2355  NA 141  K 4.3  CL 104  CO2 27  GLUCOSE 101*  BUN 19  CREATININE 0.66  CALCIUM 9.3   Coagulation Profile:  Recent Labs Lab 06/28/17 2355  INR 0.90   Urine analysis:    Component Value Date/Time   COLORURINE YELLOW 08/17/2010 1328   APPEARANCEUR CLOUDY (A) 08/17/2010 1328   LABSPEC 1.030 08/17/2010 1328   PHURINE 6.0 08/17/2010 1328   GLUCOSEU NEGATIVE 08/17/2010 1328   HGBUR NEGATIVE 08/17/2010 1328   BILIRUBINUR SMALL (A) 08/17/2010 1328   KETONESUR 15 (A) 08/17/2010 1328   PROTEINUR NEGATIVE 08/17/2010 1328   UROBILINOGEN 1.0 08/17/2010 1328   NITRITE POSITIVE (A) 08/17/2010 1328   LEUKOCYTESUR LARGE (A) 08/17/2010 1328   Radiological Exams on Admission: Dg Chest 2 View  Result Date: 06/29/2017 CLINICAL DATA:  61 year old female with chest pain. EXAM: CHEST  2 VIEW COMPARISON:  Chest CT dated 03/21/2016 and radiograph dated 12/05/2011 FINDINGS: The lungs are clear. There is no pleural effusion or pneumothorax. The cardiac silhouette is within normal limits. A curvilinear density noted over the left chest on the frontal view is not visualized on the lateral projection and likely external to the patient. Bilateral shoulder arthroplasties. No acute osseous pathology. IMPRESSION: No active cardiopulmonary disease. Electronically Signed   By: Elgie Collard M.D.   On:  06/29/2017 01:05    EKG: Independently reviewed.   Assessment/Plan Active Problems:   Tobacco abuse   Obesity   S/P shoulder replacement, left   Chest pain   Chest pain in adult    PLAN:   Chest pain:  She has significant increase risks for PE, therefore, will increase her Lovenox to therapeutic dosing at 1mg  per kg q 12 hours, starting at 4am.  Will obtain V/Q scan  given her contrast allergy.    Tobacco use:  Advised stop.  S/p recent shoulder surgery:  Stable.   HTN:  Will follow her BP.  Continue with meds.  COPD:  Continue with inhaler.  She is not SOB>   DVT prophylaxis: Full dose Lovenox.  Code Status: FULL CODE.  Family Communication: None.  Disposition Plan: Home.  Consults called: None  Admission status: OBS>    Francessca Friis MD FACP. [facility]  If 7PM-7AM, please contact night-coverage [email or web address] Password [email or web address]  06/29/2017, 2:06 AM

## 2017-06-29 NOTE — ED Provider Notes (Signed)
[facility]-EM[facility]ENCY DEPT Provider Note   CSN: [id number] Arrival date & time: 06/28/17  2335     History   Chief Complaint Chief Complaint  Patient presents with  . Chest Pain    HPI [name] is a 61 y.o. female.  HPI   [name] is a 61 y.o. female with hx of PE, 18 days S/P left shoulder replacement surgery, and currently taking Lovenox injections daily since her surgery, who presents to the Emergency Department complaining of substernal chest pain for 2 days.  She describes a constant pain to her mid lower chest.  She went to her orthopedic surgeon's office earlier today and attempted to have an US of the upper extremity, but unable to tolerate the procedure.  States that chest pain feels similar to previous PE.  She denies fever, chills, shortness of breath, redness, swelling, numbness or increased pain to the shoulder.  Denies missed Lovenox doses, pt continues to smoke.   Past Medical History:  Diagnosis Date  . Anxiety   . Chronic back pain   . COPD (chronic obstructive pulmonary disease) (HCC)   . Essential hypertension   . Fibromyalgia   . Gout   . History of cardiac catheterization    [place] - recalls having "70%" stenosis in an artery  . History of colonic polyps   . History of pulmonary embolus (PE)    Postoperative 2011  . Hyperlipidemia   . Hypothyroidism    patient denies - not on medicaion for that   . Osteoarthritis   . Prediabetes    patient denies  . Stress incontinence     Patient Active Problem List   Diagnosis Date Noted  . 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 Accelerating angina (HCC)   . Back pain 12/06/2011  . COPD exacerbation (HCC) 12/05/2011  . CAP (community acquired pneumonia) 12/05/2011  . Tobacco abuse 12/05/2011  . Obesity 12/05/2011  . Leg swelling 12/05/2011  . Benign hypertension 12/05/2011    Past Surgical History:  Procedure Laterality Date  . ABDOMINAL HYSTERECTOMY    . CARDIAC  CATHETERIZATION N/A 05/13/2016   Procedure: Left Heart Cath and Coronary Angiography;  Surgeon: Corky Crafts, MD;  Location: [facility] INVASIVE CV LAB;  Service: Cardiovascular;  Laterality: N/A;  . CERVICAL DISC SURGERY    . CHOLECYSTECTOMY    . LUMBAR SPINE SURGERY    . TOTAL KNEE ARTHROPLASTY    . TOTAL SHOULDER ARTHROPLASTY Left 06/09/2017   Procedure: Left shoulder anatomic total shoulder arthroplasty;  Surgeon: Beverely Low, MD;  Location: [facility] OR;  Service: Orthopedics;  Laterality: Left;  . TOTAL SHOULDER REPLACEMENT      OB History    No data available       Home Medications    Prior to Admission medications   Medication Sig Start Date End Date Taking? Authorizing Provider  albuterol (PROVENTIL HFA;VENTOLIN HFA) 108 (90 BASE) MCG/ACT inhaler Inhale 2 puffs into the lungs every 6 (six) hours as needed for wheezing or shortness of breath.    Yes [provider]  benazepril (LOTENSIN) 20 MG tablet Take 20 mg by mouth See admin instructions. Usually takes once daily but blood pressure has been running high and pt will take it twice daily as needed 02/06/16  Yes [provider]  enoxaparin (LOVENOX) 40 MG/0.4ML injection Inject 0.4 mLs (40 mg total) into the skin daily. 30 days post op 06/10/17  Benetta Spar, MD  EPINEPHrine (EPIPEN 2-PAK) 0.3 mg/0.3 mL  IJ SOAJ injection Inject 0.3 mg into the muscle once.   Yes [provider]  meperidine (DEMEROL) 50 MG tablet Take 50 mg by mouth every 6 (six) hours as needed for severe pain.   Yes [provider]  methocarbamol (ROBAXIN) 500 MG tablet Take 1 tablet (500 mg total) by mouth 3 (three) times daily as needed. Patient taking differently: Take 500 mg by mouth every 6 (six) hours as needed.  06/09/17  Yes Beverely Low, MD  promethazine (PHENERGAN) 50 MG tablet Take 50 mg by mouth every 6 (six) hours as needed for nausea or vomiting.   Yes [provider]  HYDROcodone-acetaminophen  (NORCO/VICODIN) 5-325 MG tablet Take 1 tablet by mouth every 4 (four) hours as needed for moderate pain.    [provider]  HYDROmorphone (DILAUDID) 2 MG tablet Take 1 tablet (2 mg total) by mouth every 4 (four) hours as needed for severe pain. 06/09/17   Beverely Low, MD  methocarbamol (ROBAXIN) 500 MG tablet Take 500 mg by mouth every 6 (six) hours as needed for muscle spasms.    [provider]    Family History Family History  Problem Relation Age of Onset  . Colon cancer Maternal Aunt   . Lung disease Father     Social History Social History  Substance Use Topics  . Smoking status: Former Smoker    Packs/day: 0.50    Years: 30.00    Types: Cigarettes    Quit date: 02/10/2016  . Smokeless tobacco: Never Used  . Alcohol use No     Allergies   Bee venom; Cephalexin; Codeine; Flexeril [cyclobenzaprine]; Iodine; Latex; Lidocaine; Morphine; Neurontin [gabapentin]; Oxycodone; Penicillins; Wellbutrin [bupropion hcl]; Gluten; Nutritional supplements; Elavil [amitriptyline hcl]; and Tobramycin   Review of Systems Review of Systems  Constitutional: Negative for chills and fever.  Respiratory: Negative for cough, chest tightness and shortness of breath.   Cardiovascular: Positive for chest pain. Negative for leg swelling.  Gastrointestinal: Negative for abdominal pain, nausea and vomiting.  Genitourinary: Negative for decreased urine volume, difficulty urinating and dysuria.  Musculoskeletal: Positive for arthralgias (left shoulder pain).  Skin: Negative for color change and rash.  Neurological: Negative for dizziness, syncope, weakness and numbness.  Psychiatric/Behavioral: Negative for confusion.     Physical Exam Updated Vital Signs BP (!) 156/94   Pulse 88   Temp 98.2 F (36.8 C) (Oral)   Resp 20   Ht 5\' 9"  (1.753 m)   Wt 104.3 kg (230 lb)   SpO2 95%   BMI 33.97 kg/m   Physical Exam  Constitutional: She is oriented to person, place, and time. She  appears well-developed and well-nourished. No distress.  HENT:  Head: Atraumatic.  Mouth/Throat: Oropharynx is clear and moist.  Neck: Normal range of motion. Neck supple. No JVD present.  Cardiovascular: Normal rate, regular rhythm, normal heart sounds and intact distal pulses.   No murmur heard. Pulmonary/Chest: Effort normal and breath sounds normal. No respiratory distress.  Mild substernal tenderness to palpation  Abdominal: Soft. She exhibits no distension. There is no tenderness. There is no guarding.  Musculoskeletal:  Well healing surgical incision to the anterior left shoulder.  Arm currently in sling.  No edema, erythema at surgical site.  Grip strength strong and symmetrical.   Neurological: She is alert and oriented to person, place, and time. No sensory deficit.  Skin: Skin is warm. Capillary refill takes less than 2 seconds.  Psychiatric: She has a normal mood and affect.  Nursing  note and vitals reviewed.    ED Treatments / Results  Labs (all labs ordered are listed, but only abnormal results are displayed) Labs Reviewed  BASIC METABOLIC PANEL - Abnormal; Notable for the following:       Result Value   Glucose, Bld 101 (*)    All other components within normal limits  D-DIMER, QUANTITATIVE (NOT AT [facility]) - Abnormal; Notable for the following:    D-Dimer, Quant 1.33 (*)    All other components within normal limits  CBC WITH DIFFERENTIAL/PLATELET  PROTIME-INR  I-STAT TROPONIN, ED    EKG  EKG Interpretation  Date/Time:  Wednesday June 28 2017 23:37:05 EDT Ventricular Rate:  80 PR Interval:    QRS Duration: 84 QT Interval:  376 QTC Calculation: 434 R Axis:   63 Text Interpretation:  Sinus rhythm Consider left atrial enlargement Abnormal R-wave progression, early transition No significant change since last tracing Confirmed by Zadie RhineWickline, Donald ([id number]) on 06/28/2017 11:40:18 PM       Radiology Dg Chest 2 View  Result Date: 06/29/2017 CLINICAL DATA:   61 year old female with chest pain. EXAM: CHEST  2 VIEW COMPARISON:  Chest CT dated 03/21/2016 and radiograph dated 12/05/2011 FINDINGS: The lungs are clear. There is no pleural effusion or pneumothorax. The cardiac silhouette is within normal limits. A curvilinear density noted over the left chest on the frontal view is not visualized on the lateral projection and likely external to the patient. Bilateral shoulder arthroplasties. No acute osseous pathology. IMPRESSION: No active cardiopulmonary disease. Electronically Signed   By: Elgie CollardArash  Radparvar M.D.   On: 06/29/2017 01:05    Procedures Procedures (including critical care time)  Medications Ordered in ED Medications  meperidine (DEMEROL) tablet 50 mg (50 mg Oral Given 06/29/17 0116)  promethazine (PHENERGAN) tablet 50 mg (50 mg Oral Given 06/29/17 0116)     Initial Impression / Assessment and Plan / ED Course  I have reviewed the triage vital signs and the nursing notes.  Pertinent labs & imaging results that were available during my care of the patient were reviewed by me and considered in my medical decision making (see chart for details).     Pt stable.  No hypoxia,  tachycardia.  Denies missed Lovenox doses.    0130  Discussed findings with Dr. Bebe ShaggyWickline who assumes pt care at end of shift.    Final Clinical Impressions(s) / ED Diagnoses   Final diagnoses:  None    New Prescriptions New Prescriptions   No medications on file     Pauline Ausriplett, Malea Swilling, Cordelia Poche-C 06/29/17 0135    Zadie RhineWickline, Donald, MD 06/29/17 [phone]

## 2017-06-29 NOTE — ED Notes (Signed)
Pt updated on plan of care, repositioned for comfort,

## 2017-06-30 DIAGNOSIS — E6609 Other obesity due to excess calories: Secondary | ICD-10-CM

## 2017-06-30 DIAGNOSIS — R079 Chest pain, unspecified: Secondary | ICD-10-CM

## 2017-06-30 DIAGNOSIS — Z6830 Body mass index (BMI) 30.0-30.9, adult: Secondary | ICD-10-CM

## 2017-06-30 LAB — HIV ANTIBODY (ROUTINE TESTING W REFLEX): HIV SCREEN 4TH GENERATION: NONREACTIVE

## 2017-06-30 LAB — TROPONIN I

## 2017-06-30 NOTE — Discharge Summary (Signed)
Physician Discharge Summary  Meghan Ruffingngela H Ruz ZOX:[id number] DOB: 09/13/1956 DOA: 06/28/2017  PCP: Patient, No Pcp Per  Admit date: 06/28/2017 Discharge date: 06/30/2017  Admitted From: Home Discharge disposition: Home   Recommendations for Outpatient Follow-Up:   1. No evidence of P.E. Or ACS, will follow up with her orthopedic surgeon.   Discharge Diagnosis:   Principal Problem:    Chest pain in adult Active Problems:    Tobacco abuse    Obesity    S/P shoulder replacement, left    Chest pain  Discharge Condition: Improved.  Diet recommendation: Low sodium, heart healthy.   History of Present Illness:   [name] is an 61 y.o. female obesity with a BMI of approximately 7734, prior pulmonary embolism, recent left shoulder surgery, hypertension, anxiety, and COPD who was admitted 06/28/17 the chief complaint of a 2 day history of retrosternal chest pain. EKG done on admission was negative for ischemic changes. Troponin was not elevated. Chest x-ray was unremarkable. D-dimer was elevated at 1.33. She was admitted to rule out pulmonary embolism.  Hospital Course by Problem:   Principal Problem:   Chest pain in adult Placed on therapeutic dose Lovenox on admission until PE was effectively ruled out. VQ scan: Normal ventilation and perfusion with no evidence of PE. EKG showed no ischemic findings but did have poor R-wave progression. Patient had a cardiac catheterization 05/2016 which showed nonobstructive coronary disease. Troponin negative.  Active Problems:   Tobacco abuse Counseled.    S/P shoulder replacement, left Sling in place. F/U orthopedics.    HIV screening Screened per guidelines, negative.    Morbid Obesity Body mass index is 33.97 kg/m.   Medical Consultants:    None.   Discharge Exam:   Vitals:   06/30/17 0540 06/30/17 1241  BP: (!) 149/81 (!) 152/93  Pulse: 80 95  Resp: 18 18  Temp: 97.8 F (36.6 C) 97.6 F (36.4 C)    Vitals:   06/29/17 1400 06/29/17 2100 06/30/17 0540 06/30/17 1241  BP: 125/73 (!) 147/65 (!) 149/81 (!) 152/93  Pulse: 80 78 80 95  Resp: 18 17 18 18   Temp:   97.8 F (36.6 C) 97.6 F (36.4 C)  TempSrc:   Oral Oral  SpO2: 96% 97% 95% 94%  Weight:      Height:        General exam: Appears calm and comfortable.  Respiratory system: Clear to auscultation. Respiratory effort normal. Cardiovascular system: S1 & S2 heard, RRR. No JVD,  rubs, gallops or clicks. No murmurs. Gastrointestinal system: Abdomen is nondistended, soft and nontender. No organomegaly or masses felt. Normal bowel sounds heard. Central nervous system: Alert and oriented. No focal neurological deficits. Extremities: No clubbing,  or cyanosis. No edema. Left arm in sling. Skin: No rashes, lesions or ulcers. Psychiatry: Judgement and insight appear normal. Mood & affect appropriate.    The results of significant diagnostics from this hospitalization (including imaging, microbiology, ancillary and laboratory) are listed below for reference.     Procedures and Diagnostic Studies:   Dg Chest 2 View  Result Date: 06/29/2017 CLINICAL DATA:  61 year old female with chest pain. EXAM: CHEST  2 VIEW COMPARISON:  Chest CT dated 03/21/2016 and radiograph dated 12/05/2011 FINDINGS: The lungs are clear. There is no pleural effusion or pneumothorax. The cardiac silhouette is within normal limits. A curvilinear density noted over the left chest on the frontal view is not visualized on the lateral projection and likely external to the patient. Bilateral  shoulder arthroplasties. No acute osseous pathology. IMPRESSION: No active cardiopulmonary disease. Electronically Signed   By: Elgie Collard M.D.   On: 06/29/2017 01:05   Nm Pulmonary Perf And Vent  Result Date: 06/29/2017 CLINICAL DATA:  Chest pain, history COPD, hypertension EXAM: NUCLEAR MEDICINE VENTILATION - PERFUSION LUNG SCAN TECHNIQUE: Ventilation images were obtained in  multiple projections using inhaled aerosol Tc-45m DTPA. Perfusion images were obtained in multiple projections after intravenous injection of Tc-30m MAA. RADIOPHARMACEUTICALS:  33 mCi Technetium-73m DTPA aerosol inhalation and 4.4 mCi Technetium-52m MAA IV COMPARISON:  None Correlation: Chest radiograph 06/29/2017 FINDINGS: Ventilation: Central airway deposition of tracer. Swallowed aerosol and stomach. No definite ventilatory defects. Perfusion: Normal Chest radiograph:  Clear lungs IMPRESSION: Normal ventilation and perfusion lung scan. Electronically Signed   By: Ulyses Southward M.D.   On: 06/29/2017 11:42     Labs:   Basic Metabolic Panel:  Recent Labs Lab 06/28/17 2355  NA 141  K 4.3  CL 104  CO2 27  GLUCOSE 101*  BUN 19  CREATININE 0.66  CALCIUM 9.3   GFR Estimated Creatinine Clearance: 96.1 mL/min (by C-G formula based on SCr of 0.66 mg/dL).  Coagulation profile  Recent Labs Lab 06/28/17 2355  INR 0.90    CBC:  Recent Labs Lab 06/28/17 2355  WBC 8.5  NEUTROABS 5.5  HGB 13.8  HCT 41.3  MCV 91.4  PLT 289   Cardiac Enzymes:  Recent Labs Lab 06/29/17 1420 06/29/17 1924 06/30/17 0209  TROPONINI <0.03 <0.03 <0.03   D-Dimer  Recent Labs  06/28/17 2355  DDIMER 1.33*   Microbiology Recent Results (from the past 240 hour(s))  MRSA PCR Screening     Status: None   Collection Time: 06/29/17  2:00 PM  Result Value Ref Range Status   MRSA by PCR NEGATIVE NEGATIVE Final    Comment:        The GeneXpert MRSA Assay (FDA approved for NASAL specimens only), is one component of a comprehensive MRSA colonization surveillance program. It is not intended to diagnose MRSA infection nor to guide or monitor treatment for MRSA infections.      Discharge Instructions:   Discharge Instructions    Call MD for:  difficulty breathing, headache or visual disturbances    Complete by:  As directed    Call MD for:  extreme fatigue    Complete by:  As directed     Call MD for:  persistant dizziness or light-headedness    Complete by:  As directed    Call MD for:  severe uncontrolled pain    Complete by:  As directed    Diet - low sodium heart healthy    Complete by:  As directed    Face-to-face encounter (required for Medicare/Medicaid patients)    Complete by:  As directed    I Casy Tavano certify that this patient is under my care and that I, or a nurse practitioner or physician's assistant working with me, had a face-to-face encounter that meets the physician face-to-face encounter requirements with this patient on 06/30/2017. The encounter with the patient was in whole, or in part for the following medical condition(s) which is the primary reason for home health care (List medical condition): Recent shoulder surgery, needs PT/OT/HHA.   The encounter with the patient was in whole, or in part, for the following medical condition, which is the primary reason for home health care:  s/p Left shoulder surgery   I certify that, based on my findings, the following  services are medically necessary home health services:  Physical therapy   Reason for Medically Necessary Home Health Services:   Therapy- Investment banker, operational, Patent examiner Therapy- Instruction on use of Assistive Device for Ambulation on all Surfaces Therapy- Instruction on Safe use of Assistive Devices for ADLs Therapy- Home Adaptation to Facilitate Safety Therapy- Therapeutic Exercises to Increase Strength and Endurance     My clinical findings support the need for the above services:  Pain interferes with ambulation/mobility   Further, I certify that my clinical findings support that this patient is homebound due to:  Pain interferes with ambulation/mobility   Home Health    Complete by:  As directed    To provide the following care/treatments:   PT OT     Increase activity slowly    Complete by:  As directed      Allergies as of 06/30/2017      Reactions   Bee Venom  Anaphylaxis   Cephalexin Anaphylaxis   Codeine Anaphylaxis, Shortness Of Breath, Rash   Flexeril [cyclobenzaprine] Shortness Of Breath, Swelling, Rash   Iodine Anaphylaxis   Latex Other (See Comments)   2nd and 3rd degree burn of skin   Lidocaine Anaphylaxis   sob   Morphine Anaphylaxis   Neurontin [gabapentin] Anaphylaxis, Shortness Of Breath, Rash   Throat starts to close up and difficulty breathing   Oxycodone Anaphylaxis   Penicillins Anaphylaxis, Rash   PATIENT HAD A PCN REACTION WITH IMMEDIATE RASH, FACIAL/TONGUE/THROAT SWELLING, SOB, OR LIGHTHEADEDNESS WITH HYPOTENSION:  #  #  #  YES  #  #  #   Has patient had a PCN reaction causing severe rash involving mucus membranes or skin necrosis: No PATIENT HAS HAD A PCN REACTION THAT REQUIRED HOSPITALIZATION:  #  #  #  YES  #  #  #   PCN reaction occurring within the last 10 years: No   Wellbutrin [bupropion Hcl] Nausea And Vomiting, Other (See Comments)   BLACKOUTS > ? SYNCOPE ?   Gluten Diarrhea, Swelling   No wheat, abdomen swelling   Elavil [amitriptyline Hcl] Other (See Comments)   Hallucinations   Tobramycin Rash      Medication List    TAKE these medications   albuterol 108 (90 Base) MCG/ACT inhaler Commonly known as:  PROVENTIL HFA;VENTOLIN HFA Inhale 1-2 puffs into the lungs every 6 (six) hours as needed for wheezing or shortness of breath.   benazepril 20 MG tablet Commonly known as:  LOTENSIN Take 20 mg by mouth See admin instructions. Usually takes once daily but blood pressure has been running high and pt will take it twice daily as needed   enoxaparin 40 MG/0.4ML injection Commonly known as:  LOVENOX Inject 0.4 mLs (40 mg total) into the skin daily. 30 days post op   EPIPEN 2-PAK 0.3 mg/0.3 mL Soaj injection Generic drug:  EPINEPHrine Inject 0.3 mg into the muscle once.   meperidine 50 MG tablet Commonly known as:  DEMEROL Take 50 mg by mouth every 6 (six) hours as needed for severe pain.   methocarbamol  500 MG tablet Commonly known as:  ROBAXIN Take 500 mg by mouth every 6 (six) hours as needed for muscle spasms.   OVER THE COUNTER MEDICATION Place 1 application onto the skin as needed.   promethazine 50 MG tablet Commonly known as:  PHENERGAN Take 50 mg by mouth every 6 (six) hours as needed for nausea or vomiting.      Follow-up Information  Beverely Low, MD Follow up.   Specialty:  Orthopedic Surgery Why:  At your scheduled appt time. Contact information: [address] [place] [address] [phone]            Time coordinating discharge: 25 minutes.  Signed:  Elinore Shults  Pager [phone] [facility] 06/30/2017, 4:49 PM

## 2017-06-30 NOTE — Care Management Obs Status (Signed)
MEDICARE OBSERVATION STATUS NOTIFICATION   Patient Details  Name: [name] MRN: [id number] Date of Birth: 02/13/1956   Medicare Observation Status Notification Given:  Yes    Osiel Stick, Chrystine OilerSharley Diane, RN 06/30/2017, 9:01 AM

## 2017-08-08 ENCOUNTER — Other Ambulatory Visit (HOSPITAL_COMMUNITY): Payer: Self-pay | Admitting: Orthopedic Surgery

## 2017-08-08 DIAGNOSIS — Z96612 Presence of left artificial shoulder joint: Principal | ICD-10-CM

## 2017-08-08 DIAGNOSIS — Z471 Aftercare following joint replacement surgery: Secondary | ICD-10-CM

## 2017-08-11 ENCOUNTER — Ambulatory Visit (HOSPITAL_COMMUNITY)
Admission: RE | Admit: 2017-08-11 | Discharge: 2017-08-11 | Disposition: A | Discharge status: Home / Self Care | Payer: Medicare Other | Source: Ambulatory Visit | Attending: Orthopedic Surgery | Admitting: Orthopedic Surgery

## 2017-08-11 DIAGNOSIS — I700 Atherosclerosis of aorta: Secondary | ICD-10-CM | POA: Insufficient documentation

## 2017-08-11 DIAGNOSIS — Z96612 Presence of left artificial shoulder joint: Secondary | ICD-10-CM | POA: Insufficient documentation

## 2017-08-11 DIAGNOSIS — Z471 Aftercare following joint replacement surgery: Secondary | ICD-10-CM | POA: Diagnosis not present

## 2017-08-15 ENCOUNTER — Ambulatory Visit (HOSPITAL_COMMUNITY): Payer: Medicare Other

## 2017-08-24 ENCOUNTER — Other Ambulatory Visit (HOSPITAL_COMMUNITY): Payer: Self-pay | Admitting: Respiratory Therapy

## 2017-08-24 DIAGNOSIS — J441 Chronic obstructive pulmonary disease with (acute) exacerbation: Secondary | ICD-10-CM

## 2017-09-08 ENCOUNTER — Ambulatory Visit (HOSPITAL_COMMUNITY)
Admission: RE | Admit: 2017-09-08 | Discharge: 2017-09-08 | Disposition: A | Discharge status: Home / Self Care | Payer: Medicare Other | Source: Ambulatory Visit | Attending: Pulmonary Disease | Admitting: Pulmonary Disease

## 2017-09-08 DIAGNOSIS — J441 Chronic obstructive pulmonary disease with (acute) exacerbation: Secondary | ICD-10-CM | POA: Diagnosis present

## 2017-09-08 MED ORDER — ALBUTEROL SULFATE (2.5 MG/3ML) 0.083% IN NEBU
2.5000 mg | INHALATION_SOLUTION | Freq: Once | RESPIRATORY_TRACT | Status: AC
  Administered 2017-09-08: 2.5 mg via RESPIRATORY_TRACT

## 2017-09-18 LAB — PULMONARY FUNCTION TEST
DL/VA % PRED: 59 %
DL/VA: 3.26 ml/min/mmHg/L
DLCO COR % PRED: 51 %
DLCO cor: 16.73 ml/min/mmHg
DLCO unc % pred: 51 %
DLCO unc: 16.73 ml/min/mmHg
FEF 25-75 PRE: 1.06 L/s
FEF 25-75 Post: 1.6 L/sec
FEF2575-%Change-Post: 50 %
FEF2575-%PRED-POST: 59 %
FEF2575-%PRED-PRE: 39 %
FEV1-%CHANGE-POST: 12 %
FEV1-%PRED-POST: 64 %
FEV1-%Pred-Pre: 57 %
FEV1-PRE: 1.8 L
FEV1-Post: 2.02 L
FEV1FVC-%Change-Post: 0 %
FEV1FVC-%PRED-PRE: 87 %
FEV6-%Change-Post: 12 %
FEV6-%PRED-POST: 75 %
FEV6-%Pred-Pre: 67 %
FEV6-POST: 2.97 L
FEV6-Pre: 2.65 L
FEV6FVC-%CHANGE-POST: 0 %
FEV6FVC-%PRED-POST: 102 %
FEV6FVC-%Pred-Pre: 103 %
FVC-%CHANGE-POST: 12 %
FVC-%PRED-POST: 73 %
FVC-%Pred-Pre: 65 %
FVC-Post: 2.98 L
FVC-Pre: 2.65 L
POST FEV1/FVC RATIO: 68 %
PRE FEV1/FVC RATIO: 68 %
PRE FEV6/FVC RATIO: 100 %
Post FEV6/FVC ratio: 100 %

## 2017-10-17 ENCOUNTER — Ambulatory Visit (HOSPITAL_COMMUNITY): Payer: Medicare Other

## 2017-10-23 ENCOUNTER — Encounter (HOSPITAL_COMMUNITY): Payer: Self-pay

## 2017-10-23 ENCOUNTER — Ambulatory Visit (HOSPITAL_COMMUNITY): Payer: Medicare Other | Attending: Pulmonary Disease

## 2017-10-23 DIAGNOSIS — M25612 Stiffness of left shoulder, not elsewhere classified: Secondary | ICD-10-CM

## 2017-10-23 DIAGNOSIS — R29898 Other symptoms and signs involving the musculoskeletal system: Secondary | ICD-10-CM | POA: Diagnosis present

## 2017-10-23 DIAGNOSIS — G8929 Other chronic pain: Secondary | ICD-10-CM | POA: Diagnosis present

## 2017-10-23 DIAGNOSIS — M25512 Pain in left shoulder: Secondary | ICD-10-CM | POA: Diagnosis present

## 2017-10-23 NOTE — Therapy (Signed)
[facility] [address] [place], [place], [address] Phone: [phone]   Fax:  [phone]  Occupational Therapy Evaluation  Patient Details  Name: [name] MRN: [id number] Date of Birth: 08/21/1956 Referring Provider: Dr. Kari BaarsEdward Hawkins   Encounter Date: 10/23/2017  OT End of Session - 10/23/17 1139    Visit Number  1    Number of Visits  24    Date for OT Re-Evaluation  12/22/17    Authorization Type  1) UHC medicare 2) medicaid    Authorization Time Period  before 10th visit    Authorization - Visit Number  1    Authorization - Number of Visits  10    OT Start Time  0945    OT Stop Time  1030    OT Time Calculation (min)  45 min    Activity Tolerance  Patient tolerated treatment well;Patient limited by pain    Behavior During Therapy  [facility] for tasks assessed/performed       Past Medical History:  Diagnosis Date  . Anxiety   . Asthma   . Chronic back pain   . COPD (chronic obstructive pulmonary disease) (HCC)   . Essential hypertension   . Fibromyalgia   . Gout   . History of cardiac catheterization    [place] 2012 - recalls having "70%" stenosis in an artery  . History of colonic polyps   . History of pulmonary embolus (PE)    Postoperative 2011  . Hyperlipidemia   . Hypothyroidism    patient denies - not on medicaion for that   . Osteoarthritis   . Prediabetes    patient denies  . Stress incontinence     Past Surgical History:  Procedure Laterality Date  . ABDOMINAL HYSTERECTOMY    . CERVICAL DISC SURGERY    . CHOLECYSTECTOMY    . LUMBAR SPINE SURGERY    . TOTAL KNEE ARTHROPLASTY    . TOTAL SHOULDER REPLACEMENT      There were no vitals filed for this visit.  Subjective Assessment - 10/23/17 0956    Subjective   S: I did my right shoulder. It was a reverse total shoulder replacement. It took my 2 years to get it back to where it needs to be.    Pertinent History  Patient is a 61 y/o female S/P  left shoulder replacement which occured on 06/09/17 by Dr. Ranell PatrickNorris. Patient has experience in her right shoulder in which she underwent a reverse total shoulder replacement. patient completed only 2 appointments of therapy and then requested to complete her therapy independently at home. Dr. Juanetta GoslingHawkins has referred patient to occupational therapy for evaluation and treatment.     Special Tests  FOTO score: 34/100    Patient Stated Goals  To have more ROM.    Currently in Pain?  No/denies        [facility] - 10/23/17 0956      Assessment   Diagnosis  Left shoulder replacement    Referring Provider  Dr. Kari BaarsEdward Hawkins    Onset Date  06/09/17    Assessment  Dr. Ranell PatrickNorris has released her from services. she is follow up with Dr. Juanetta GoslingHawkins    Prior Therapy  None      Precautions   Precautions  Shoulder    Type of Shoulder Precautions  No lifting anything heavier than a gallon of milk      Restrictions   Weight Bearing Restrictions  Yes  Balance Screen   Has the patient fallen in the past 6 months  No      Home  Environment   Family/patient expects to be discharged to:  Private residence      Prior Function   Level of Independence  Independent    Vocation  On disability    Vocation Requirements  patient used to be truck Hospital doctordriver.    Leisure  [place]      ADL   ADL comments  Difficulty with reaching above shoulder, decreased strength.      Mobility   Mobility Status  Independent      Written Expression   Dominant Hand  Right      Vision - History   Baseline Vision  Wears glasses all the time      Cognition   Overall Cognitive Status  Within Functional Limits for tasks assessed      ROM / Strength   AROM / PROM / Strength  AROM;PROM;Strength      Palpation   Palpation comment  Max fascial restrictions in left upper arm, trapezius, and scapularis region. Multiple trigger points and tender all over.      AROM   Overall AROM  Comments  Assessed semi supine. IR/er adducted    AROM Assessment Site  Shoulder    Right/Left Shoulder  Left    Left Shoulder Flexion  40 Degrees    Left Shoulder ABduction  60 Degrees    Left Shoulder Internal Rotation  80 Degrees    Left Shoulder External Rotation  43 Degrees      PROM   Overall PROM Comments  Assessed semi supine. IR/er adducted    PROM Assessment Site  Shoulder    Right/Left Shoulder  Left    Left Shoulder Flexion  85 Degrees    Left Shoulder ABduction  84 Degrees    Left Shoulder Internal Rotation  90 Degrees    Left Shoulder External Rotation  50 Degrees      Strength   Overall Strength Comments  Assessed seated. IR/er adducted    Strength Assessment Site  Shoulder    Right/Left Shoulder  Left    Left Shoulder Flexion  3-/5    Left Shoulder ABduction  3-/5    Left Shoulder Internal Rotation  3/5    Left Shoulder External Rotation  3-/5                      OT Education - 10/23/17 1110    Education provided  Yes    Education Details  table slides, row and extension A/ROm scapular exercises. Discussed using moist heat prior to exercises to relax muscles. Discussed the upper trapezius taking over when trying to raise her shoulder up and to try and refrain from shrugging     Person(s) Educated  Patient    Methods  Explanation;Handout;Demonstration;Tactile cues;Verbal cues    Comprehension  Verbalized understanding       OT Short Term Goals - 10/23/17 1256      OT SHORT TERM GOAL #1   Title  Patient will be educated and independent with HEP to faciliate progress in therapy and increase functional use of LUE during daily tasks.     Time  6    Period  Weeks    Status  New    Target Date  12/04/17      OT SHORT TERM GOAL #2   Title  Patient  will increase P/ROM to WNL to increase ability to complete dressing tasks easier with less compensatory strategies and pain.    Time  6    Period  Weeks    Status  New      OT SHORT TERM GOAL #3    Title  Patient will increase LUE strength to 3/5 to increase ability to reach an overhead shelf with less difficulty.     Time  6    Period  Weeks    Status  New      OT SHORT TERM GOAL #4   Title  Patient will decrease fascial restrictions in LUE to mod amount in order to increase functional mobility needed to begin reaching above shoulder level.     Time  6    Period  Weeks    Status  New      OT SHORT TERM GOAL #5   Title  Patient will decrease pain level when using LUE during daily tasks to 5/10 or less.     Time  6    Period  Weeks    Status  New        OT Long Term Goals - 10/23/17 1341      OT LONG TERM GOAL #1   Title  Patient will return to highest level of independence with all daily and leisure tasks using LUE for non-dominant extremity.    Time  12    Period  Weeks    Status  New    Target Date  01/15/18      OT LONG TERM GOAL #2   Title  Patient will increase A/ROM of LUE to [facility] to increase ability to reach into overhead cabinets and overhead for needed tasks.     Time  12    Period  Weeks    Status  New      OT LONG TERM GOAL #3   Title  Patient will increase LUE strength to 4/5 in order to return to normal lifting activities at home.    Time  12    Period  Weeks    Status  New      OT LONG TERM GOAL #4   Title  Patient will decrease fascial restrictions in LUE to Min amount or less in order to increase functional mobility needed to complete reaching activities.     Time  12    Period  Weeks    Status  New      OT LONG TERM GOAL #5   Title  Patient will decrease pain in LUE during daily tasks to 3/10 or less.    Time  12    Period  Weeks    Status  New            Plan - 10/23/17 1147    Clinical Impression Statement  A: Patient is a 61 y/o female S/P left shoulder replacement causing increased pain, fascial restrictions and decreased strength and ROM resulting in difficulty completing daily tasks using her LUE.    Occupational Profile and  client history currently impacting functional performance  motiviated to return to prior level of function. Strong social support at home.    Occupational performance deficits (Please refer to evaluation for details):  ADL's;Leisure;IADL's    Rehab Potential  Good    Current Impairments/barriers affecting progress:  Impatient with progress from previous experience of shoulder surgery. Increased pain. Hx of back issues. Hx of multiple surguries.    OT  Frequency  2x / week    OT Duration  12 weeks    OT Treatment/Interventions  Ultrasound;Self-care/ADL training;DME and/or AE instruction;Iontophoresis;Cryotherapy;Electrical Stimulation;Moist Heat;Therapeutic activities;Therapeutic exercises;Manual Therapy;Passive range of motion;Patient/family education    Plan  P: Patient will benefit from skilled OT services to increase functional use of LUE during daily tasks. Treatment Plan: Myofascial release, manual stretching, P/ROM, AA/ROM, A/ROM, general strengthening. Modalities PRN.    Clinical Decision Making  Limited treatment options, no task modification necessary    OT Home Exercise Plan  November 17, 2023: table slides, row and extension scapular A/ROM.    Consulted and Agree with Plan of Care  Patient       Patient will benefit from skilled therapeutic intervention in order to improve the following deficits and impairments:  Decreased strength, Decreased range of motion, Pain, Impaired UE functional use, Increased fascial restricitons  Visit Diagnosis: Other symptoms and signs involving the musculoskeletal system - Plan: Ot plan of care cert/re-cert  Chronic left shoulder pain - Plan: Ot plan of care cert/re-cert  Stiffness of left shoulder, not elsewhere classified - Plan: Ot plan of care cert/re-cert  G-Codes - 2017/11/16 1511    Functional Assessment Tool Used (Outpatient only)  FOTO score: 34/100 ( 66% impaired)    Functional Limitation  Carrying, moving and handling objects    Carrying, Moving and  Handling Objects Current Status (Z6109)  At least 60 percent but less than 80 percent impaired, limited or restricted    Carrying, Moving and Handling Objects Goal Status (U0454)  At least 40 percent but less than 60 percent impaired, limited or restricted       Problem List Patient Active Problem List   Diagnosis Date Noted  . Chest pain 06/29/2017  . Chest pain in adult 06/29/2017  . 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 Accelerating angina (HCC)   . Back pain 12/06/2011  . COPD exacerbation (HCC) 12/05/2011  . CAP (community acquired pneumonia) 12/05/2011  . Tobacco abuse 12/05/2011  . Obesity 12/05/2011  . Leg swelling 12/05/2011  . Benign hypertension 12/05/2011   Limmie Patricia, OTR/L,CBIS  [phone]  2017/11/16, 3:14 PM  [facility] [address] [place], [place], [address] Phone: [phone]   Fax:  [phone]  Name: [name] MRN: [id number] Date of Birth: 1956/09/07

## 2017-10-23 NOTE — Patient Instructions (Signed)
Complete the follow exercises 3-4 times a day. Complete each exercise 1 minute each.  SHOULDER: Flexion On Table   Place hands on table, elbows straight. Move hips away from body. Press hands down into table. Hold ___ seconds. ___ reps per set, ___ sets per day, ___ days per week  Abduction (Passive)   With arm out to side, resting on table, lower head toward arm, keeping trunk away from table. Hold ____ seconds. Repeat ____ times. Do ____ sessions per day.  Copyright  VHI. All rights reserved.     Internal Rotation (Assistive)   Seated with elbow bent at right angle and held against side, slide arm on table surface in an inward arc. Repeat ____ times. Do ____ sessions per day. Activity: Use this motion to brush crumbs off the table.  Copyright  VHI. All rights reserved.     1) Seated Row   Sit up straight with elbows by your sides. Pull back with shoulders/elbows, keeping forearms straight, as if pulling back on the reins of a horse. Squeeze shoulder blades together. Repeat _10__times, ____sets/day      3) Shoulder Extension    Sit up straight with both arms by your side, draw your arms back behind your waist. Keep your elbows straight. Repeat __10__times, ____sets/day.

## 2017-10-27 ENCOUNTER — Ambulatory Visit (HOSPITAL_COMMUNITY): Payer: Medicare Other

## 2017-10-27 ENCOUNTER — Encounter (HOSPITAL_COMMUNITY): Payer: Self-pay

## 2017-10-27 DIAGNOSIS — M25512 Pain in left shoulder: Secondary | ICD-10-CM

## 2017-10-27 DIAGNOSIS — M25612 Stiffness of left shoulder, not elsewhere classified: Secondary | ICD-10-CM

## 2017-10-27 DIAGNOSIS — R29898 Other symptoms and signs involving the musculoskeletal system: Secondary | ICD-10-CM

## 2017-10-27 DIAGNOSIS — G8929 Other chronic pain: Secondary | ICD-10-CM

## 2017-10-27 NOTE — Therapy (Signed)
[facility] [address] , [place], [address] Phone: [phone]   Fax:  [phone]  Occupational Therapy Treatment  Patient Details  Name: [name] MRN: [id number] Date of Birth: 03/04/1956 Referring Provider: Dr. Kari BaarsEdward Hawkins   Encounter Date: 10/27/2017  OT End of Session - 10/27/17 1034    Visit Number  2    Number of Visits  24    Date for OT Re-Evaluation  12/22/17    Authorization Type  1) UHC medicare 2) medicaid    Authorization Time Period  before 10th visit    Authorization - Visit Number  2    Authorization - Number of Visits  10    OT Start Time  [phone]    OT Stop Time  1038    OT Time Calculation (min)  48 min    Activity Tolerance  Patient tolerated treatment well;Patient limited by pain    Behavior During Therapy  [facility] for tasks assessed/performed       Past Medical History:  Diagnosis Date  . Anxiety   . Asthma   . Chronic back pain   . COPD (chronic obstructive pulmonary disease) (HCC)   . Essential hypertension   . Fibromyalgia   . Gout   . History of cardiac catheterization    [place] 2012 - recalls having "70%" stenosis in an artery  . History of colonic polyps   . History of pulmonary embolus (PE)    Postoperative 2011  . Hyperlipidemia   . Hypothyroidism    patient denies - not on medicaion for that   . Osteoarthritis   . Prediabetes    patient denies  . Stress incontinence     Past Surgical History:  Procedure Laterality Date  . ABDOMINAL HYSTERECTOMY    . CERVICAL DISC SURGERY    . CHOLECYSTECTOMY    . Left Heart Cath and Coronary Angiography N/A 05/13/2016   Performed by Corky CraftsVaranasi, Jayadeep S, [facility]  . Left shoulder anatomic total shoulder arthroplasty Left 06/09/2017   Performed by Beverely LowNorris, Steve, [facility]  . LUMBAR SPINE SURGERY    . TOTAL KNEE ARTHROPLASTY    . TOTAL SHOULDER REPLACEMENT      There were no vitals filed for this  visit.  Subjective Assessment - 10/27/17 1008    Subjective   S: I took a shower this morning and let that hot water run on my shoulder to loosen it up.    Currently in Pain?  Yes    Pain Score  5     Pain Location  Shoulder    Pain Orientation  Left    Pain Descriptors / Indicators  Sharp;Stabbing;Constant    Pain Type  Chronic pain    Pain Radiating Towards  Up to the neck and shoulder blade.    Pain Onset  In the past 7 days    Pain Frequency  Constant    Aggravating Factors   Movement and use    Pain Relieving Factors  pain medications; hot water in shower    Effect of Pain on Daily Activities  Severe effect    Multiple Pain Sites  No         [facility] OT Assessment - 10/27/17 1033      Assessment   Diagnosis  Left shoulder replacement      Precautions   Precautions  Shoulder    Type of Shoulder Precautions  No lifting anything  heavier than a gallon of milk               OT Treatments/Exercises (OP) - 10/27/17 1017      Exercises   Exercises  Shoulder      Shoulder Exercises: Supine   Protraction  PROM;10 reps    Horizontal ABduction  PROM;10 reps    External Rotation  PROM;10 reps    Internal Rotation  PROM;10 reps    Flexion  PROM;10 reps    ABduction  PROM;10 reps      Shoulder Exercises: Seated   Extension  AROM;10 reps    Row  AROM;10 reps    Other Seated Exercises  Depression; 10X; A/ROM      Shoulder Exercises: Therapy Ball   Flexion  10 reps    ABduction  10 reps      Shoulder Exercises: Isometric Strengthening   Flexion  Supine;5X5"    Extension  Supine;5X5"    External Rotation  Supine;5X5"    Internal Rotation  Supine;5X5"    ABduction  Supine;5X5"    ADduction  Supine;5X5"      Modalities   Modalities  Moist Heat      Moist Heat Therapy   Number Minutes Moist Heat  10 Minutes    Moist Heat Location  Shoulder      Manual Therapy   Manual Therapy  Myofascial release    Manual therapy comments  Manual therapy completed prior to  exercises.    Myofascial Release  Myofascial release and manual stretching completed to left upper arm, trapezious, and scapularis region.              OT Education - 10/27/17 1057    Education provided  Yes    Education Details  Pt was given OT evaluation for goals and plan of care reference.    Person(s) Educated  Patient    Methods  Explanation;Handout    Comprehension  Verbalized understanding       OT Short Term Goals - 10/27/17 1056      OT SHORT TERM GOAL #1   Title  Patient will be educated and independent with HEP to faciliate progress in therapy and increase functional use of LUE during daily tasks.     Time  6    Period  Weeks    Status  On-going      OT SHORT TERM GOAL #2   Title  Patient will increase P/ROM to WNL to increase ability to complete dressing tasks easier with less compensatory strategies and pain.    Time  6    Period  Weeks    Status  On-going      OT SHORT TERM GOAL #3   Title  Patient will increase LUE strength to 3/5 to increase ability to reach an overhead shelf with less difficulty.     Time  6    Period  Weeks    Status  On-going      OT SHORT TERM GOAL #4   Title  Patient will decrease fascial restrictions in LUE to mod amount in order to increase functional mobility needed to begin reaching above shoulder level.     Time  6    Period  Weeks    Status  On-going      OT SHORT TERM GOAL #5   Title  Patient will decrease pain level when using LUE during daily tasks to 5/10 or less.     Time  6  Period  Weeks    Status  On-going        OT Long Term Goals - 10/27/17 1056      OT LONG TERM GOAL #1   Title  Patient will return to highest level of independence with all daily and leisure tasks using LUE for non-dominant extremity.    Time  12    Period  Weeks    Status  On-going      OT LONG TERM GOAL #2   Title  Patient will increase A/ROM of LUE to [facility] to increase ability to reach into overhead cabinets and overhead for  needed tasks.     Time  12    Period  Weeks    Status  On-going      OT LONG TERM GOAL #3   Title  Patient will increase LUE strength to 4/5 in order to return to normal lifting activities at home.    Time  12    Period  Weeks    Status  On-going      OT LONG TERM GOAL #4   Title  Patient will decrease fascial restrictions in LUE to Min amount or less in order to increase functional mobility needed to complete reaching activities.     Time  12    Period  Weeks    Status  On-going      OT LONG TERM GOAL #5   Title  Patient will decrease pain in LUE during daily tasks to 3/10 or less.    Time  12    Period  Weeks    Status  On-going            Plan - 10/27/17 1035    Clinical Impression Statement  A: Initiated myofascial release, manual stretching, isometrics, and therapy ball stretches. Pt has increased pain and muscle guarding during session. Was limited by pain. Required VC for form and technique. She was able to tolerate more P/ROM during flexion. Requested moist heat at end of session.    Plan  P: Attempt ultrasound for pain. Continue with passive ROM. Add thumb tacks if able to tolerate.    Consulted and Agree with Plan of Care  Patient       Patient will benefit from skilled therapeutic intervention in order to improve the following deficits and impairments:  Decreased strength, Decreased range of motion, Pain, Impaired UE functional use, Increased fascial restricitons  Visit Diagnosis: Other symptoms and signs involving the musculoskeletal system  Chronic left shoulder pain  Stiffness of left shoulder, not elsewhere classified    Problem List Patient Active Problem List   Diagnosis Date Noted  . Chest pain 06/29/2017  . Chest pain in adult 06/29/2017  . 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 Accelerating angina (HCC)   . Back pain 12/06/2011  . COPD exacerbation (HCC) 12/05/2011  . CAP (community acquired pneumonia) 12/05/2011  . Tobacco abuse  12/05/2011  . Obesity 12/05/2011  . Leg swelling 12/05/2011  . Benign hypertension 12/05/2011   Limmie PatriciaLaura Nilton Lave, OTR/L,CBIS  [phone]  10/27/2017, 10:59 AM  [place] [facility] [address] [place], [place], [address] Phone: [phone]   Fax:  [phone]  Name: [name] MRN: [id number] Date of Birth: 11/16/1956

## 2017-11-06 ENCOUNTER — Ambulatory Visit (HOSPITAL_COMMUNITY): Payer: Medicare Other

## 2017-11-06 ENCOUNTER — Telehealth (HOSPITAL_COMMUNITY): Payer: Self-pay | Admitting: General Practice

## 2017-11-06 NOTE — Telephone Encounter (Signed)
11/06/17  cx today because her back is out

## 2017-11-08 ENCOUNTER — Encounter (HOSPITAL_COMMUNITY): Payer: Self-pay

## 2017-11-08 ENCOUNTER — Ambulatory Visit (HOSPITAL_COMMUNITY): Payer: Medicare Other

## 2017-11-08 DIAGNOSIS — R29898 Other symptoms and signs involving the musculoskeletal system: Secondary | ICD-10-CM | POA: Diagnosis not present

## 2017-11-08 DIAGNOSIS — M25512 Pain in left shoulder: Secondary | ICD-10-CM

## 2017-11-08 DIAGNOSIS — G8929 Other chronic pain: Secondary | ICD-10-CM

## 2017-11-08 DIAGNOSIS — M25612 Stiffness of left shoulder, not elsewhere classified: Secondary | ICD-10-CM

## 2017-11-08 NOTE — Therapy (Signed)
[facility] [address] [place], [place], [address] Phone: [phone]   Fax:  [phone]  Occupational Therapy Treatment  Patient Details  Name: [name] MRN: [id number] Date of Birth: 09/25/1956 Referring Provider: Dr. Kari BaarsEdward Hawkins   Encounter Date: 11/08/2017  OT End of Session - 11/08/17 1001    Visit Number  3    Number of Visits  24    Date for OT Re-Evaluation  12/22/17 mini reassess: 11/20/17    Authorization Type  1) UHC medicare 2) medicaid    Authorization Time Period  before 10th visit    Authorization - Visit Number  3    Authorization - Number of Visits  10    OT Start Time  [phone]    OT Stop Time  1035    OT Time Calculation (min)  45 min    Activity Tolerance  Patient tolerated treatment well;Patient limited by pain    Behavior During Therapy  [facility] for tasks assessed/performed;Flat affect       Past Medical History:  Diagnosis Date  . Anxiety   . Asthma   . Chronic back pain   . COPD (chronic obstructive pulmonary disease) (HCC)   . Essential hypertension   . Fibromyalgia   . Gout   . [place] of [place] - recalls having "70%" stenosis in an artery  . History of colonic polyps   . History of pulmonary embolus (PE)    Postoperative 2011  . Hyperlipidemia   . Hypothyroidism    patient denies - not on medicaion for that   . Osteoarthritis   . Prediabetes    patient denies  . Stress incontinence     Past Surgical History:  Procedure Laterality Date  . ABDOMINAL HYSTERECTOMY    . CARDIAC CATHETERIZATION N/A 05/13/2016   Procedure: Left Heart Cath and Coronary Angiography;  Surgeon: Corky CraftsJayadeep S Varanasi, MD;  [facility];  Service: Cardiovascular;  Laterality: N/A;  . CERVICAL DISC SURGERY    . CHOLECYSTECTOMY    . LUMBAR SPINE SURGERY    . TOTAL KNEE ARTHROPLASTY    . TOTAL SHOULDER ARTHROPLASTY Left 06/09/2017   Procedure: Left shoulder anatomic  total shoulder arthroplasty;  Surgeon: Beverely LowNorris, Steve, MD;  [facility];  Service: Orthopedics;  Laterality: Left;  . TOTAL SHOULDER REPLACEMENT      There were no vitals filed for this visit.  Subjective Assessment - 11/08/17 1031    Subjective   S: My back has just been killing me. I couldn't come in on Monday.    Currently in Pain?  Yes    Pain Score  6     Pain Location  Shoulder    Pain Orientation  Left    Pain Descriptors / Indicators  Stabbing;Throbbing    Pain Radiating Towards  up to neck and shoulder blade.    Pain Onset  1 to 4 weeks ago    Pain Frequency  Intermittent    Aggravating Factors   movement and use    Pain Relieving Factors  pain medication, hot water in shower, hot packs    Effect of Pain on Daily Activities  mo-severe effect. Today the back pain is preventing her from completing her daily tasks.     Multiple Pain Sites  No         [facility] OT Assessment - 11/08/17 1005      Assessment   Diagnosis  Left shoulder replacement      Precautions   Precautions  Shoulder    Type of Shoulder Precautions  No lifting anything heavier than a gallon of milk               OT Treatments/Exercises (OP) - 11/08/17 1005      Exercises   Exercises  Shoulder      Shoulder Exercises: Seated   Protraction  PROM;AAROM;10 reps    Horizontal ABduction  PROM;10 reps    External Rotation  PROM;AAROM;10 reps    Internal Rotation  PROM;AAROM;10 reps    Flexion  PROM;AAROM;10 reps    Abduction  PROM;AAROM;10 reps      Shoulder Exercises: Therapy Ball   Flexion  -- 12X    ABduction  -- 12X      Shoulder Exercises: ROM/Strengthening   Wall Wash  40 seconds    Thumb Tacks  1'      Shoulder Exercises: Isometric Strengthening   Flexion  5X5" seated    Extension  5X5" seated    External Rotation  5X5" seated    Internal Rotation  5X5" seated    ABduction  5X5" seated    ADduction  5X5" seated      Modalities   Modalities  Moist Heat      Moist Heat  Therapy   Number Minutes Moist Heat  10 Minutes    Moist Heat Location  Shoulder               OT Short Term Goals - 10/27/17 1056      OT SHORT TERM GOAL #1   Title  Patient will be educated and independent with HEP to faciliate progress in therapy and increase functional use of LUE during daily tasks.     Time  6    Period  Weeks    Status  On-going      OT SHORT TERM GOAL #2   Title  Patient will increase P/ROM to WNL to increase ability to complete dressing tasks easier with less compensatory strategies and pain.    Time  6    Period  Weeks    Status  On-going      OT SHORT TERM GOAL #3   Title  Patient will increase LUE strength to 3/5 to increase ability to reach an overhead shelf with less difficulty.     Time  6    Period  Weeks    Status  On-going      OT SHORT TERM GOAL #4   Title  Patient will decrease fascial restrictions in LUE to mod amount in order to increase functional mobility needed to begin reaching above shoulder level.     Time  6    Period  Weeks    Status  On-going      OT SHORT TERM GOAL #5   Title  Patient will decrease pain level when using LUE during daily tasks to 5/10 or less.     Time  6    Period  Weeks    Status  On-going        OT Long Term Goals - 10/27/17 1056      OT LONG TERM GOAL #1   Title  Patient will return to highest level of independence with all daily and leisure tasks using LUE for non-dominant extremity.    Time  12    Period  Weeks    Status  On-going  OT LONG TERM GOAL #2   Title  Patient will increase A/ROM of LUE to [facility] to increase ability to reach into overhead cabinets and overhead for needed tasks.     Time  12    Period  Weeks    Status  On-going      OT LONG TERM GOAL #3   Title  Patient will increase LUE strength to 4/5 in order to return to normal lifting activities at home.    Time  12    Period  Weeks    Status  On-going      OT LONG TERM GOAL #4   Title  Patient will decrease  fascial restrictions in LUE to Min amount or less in order to increase functional mobility needed to complete reaching activities.     Time  12    Period  Weeks    Status  On-going      OT LONG TERM GOAL #5   Title  Patient will decrease pain in LUE during daily tasks to 3/10 or less.    Time  12    Period  Weeks    Status  On-going            Plan - 11/08/17 1007    Clinical Impression Statement  A: Pt requested to completed session seated with no myofascial release. Completed AA/ROM seated although was unable to  complete all due to increased back pain. Added wall wash for 40 seconds and thumb tacks to increase functional use of LUE. Pt reports that she has been using her left arm more during daily tasks including being able to wash her cast iron skillet independently.     Plan  P: Complete all AA/ROM exercises seated. No myofascial release per patient's request. Updated HEP. Add pulleys       Patient will benefit from skilled therapeutic intervention in order to improve the following deficits and impairments:  Decreased strength, Decreased range of motion, Pain, Impaired UE functional use, Increased fascial restricitons  Visit Diagnosis: Other symptoms and signs involving the musculoskeletal system  Chronic left shoulder pain  Stiffness of left shoulder, not elsewhere classified    Problem List Patient Active Problem List   Diagnosis Date Noted  . Chest pain 06/29/2017  . Chest pain in adult 06/29/2017  . 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 Accelerating angina (HCC)   . Back pain 12/06/2011  . COPD exacerbation (HCC) 12/05/2011  . CAP (community acquired pneumonia) 12/05/2011  . Tobacco abuse 12/05/2011  . Obesity 12/05/2011  . Leg swelling 12/05/2011  . Benign hypertension 12/05/2011   Limmie PatriciaLaura Marissa Lowrey, OTR/L,CBIS  [phone]  11/08/2017, 10:34 AM  [facility] [address] [place], [place],  [address] Phone: [phone]   Fax:  [phone]  Name: [name] MRN: [id number] Date of Birth: 09/30/1956

## 2017-11-13 ENCOUNTER — Ambulatory Visit (HOSPITAL_COMMUNITY): Payer: Medicare Other | Attending: Pulmonary Disease

## 2017-11-13 ENCOUNTER — Encounter (HOSPITAL_COMMUNITY): Payer: Self-pay

## 2017-11-13 DIAGNOSIS — M25512 Pain in left shoulder: Secondary | ICD-10-CM | POA: Diagnosis not present

## 2017-11-13 DIAGNOSIS — M25612 Stiffness of left shoulder, not elsewhere classified: Secondary | ICD-10-CM | POA: Insufficient documentation

## 2017-11-13 DIAGNOSIS — R29898 Other symptoms and signs involving the musculoskeletal system: Secondary | ICD-10-CM | POA: Insufficient documentation

## 2017-11-13 DIAGNOSIS — G8929 Other chronic pain: Secondary | ICD-10-CM | POA: Insufficient documentation

## 2017-11-13 NOTE — Patient Instructions (Signed)
Perform each exercise ___10-15_____ reps. 2-3x days.   Protraction - STANDING  Start by holding a wand or cane at chest height.  Next, slowly push the wand outwards in front of your body so that your elbows become fully straightened. Then, return to the original position.     Shoulder FLEXION - STANDING - PALMS DOWN  In the standing position, hold a wand/cane with both arms, palms up on both sides. Raise up the wand/cane allowing your unaffected arm to perform most of the effort. Your affected arm should be partially relaxed.      Internal/External ROTATION - STANDING  In the standing position, hold a wand/cane with both hands keeping your elbows bent. Move your arms and wand/cane to one side.  Your affected arm should be partially relaxed while your unaffected arm performs most of the effort.       Shoulder ABDUCTION - STANDING  While holding a wand/cane palm face up on the injured side and palm face down on the uninjured side, slowly raise up your injured arm to the side.        Horizontal Abduction/Adduction      Straight arms holding cane at shoulder height, bring cane to right, center, left. Repeat starting to left.   Copyright  VHI. All rights reserved.          

## 2017-11-13 NOTE — Therapy (Signed)
[facility] [address] [place], [place], [address] Phone: [phone]   Fax:  [phone]  Occupational Therapy Treatment  Patient Details  Name: [name] MRN: [id number] Date of Birth: 1956-04-17 Referring Provider: Dr. Kari Baars   Encounter Date: 11/13/2017  OT End of Session - 11/13/17 1001    Visit Number  4    Number of Visits  24    Date for OT Re-Evaluation  12/22/17 mini reassess: 11/20/17    Authorization Type  1) UHC medicare 2) medicaid    Authorization Time Period  before 10th visit    Authorization - Visit Number  4    Authorization - Number of Visits  10    OT Start Time  [id number]    OT Stop Time  1030    OT Time Calculation (min)  40 min    Activity Tolerance  Patient tolerated treatment well;Patient limited by pain    Behavior During Therapy  [facility] for tasks assessed/performed;Flat affect       Past Medical History:  Diagnosis Date  . Anxiety   . Asthma   . Chronic back pain   . COPD (chronic obstructive pulmonary disease) (HCC)   . Essential hypertension   . Fibromyalgia   . Gout   . History of cardiac catheterization    [place] - recalls having "70%" stenosis in an artery  . History of colonic polyps   . History of pulmonary embolus (PE)    Postoperative 2011  . Hyperlipidemia   . Hypothyroidism    patient denies - not on medicaion for that   . Osteoarthritis   . Prediabetes    patient denies  . Stress incontinence     Past Surgical History:  Procedure Laterality Date  . ABDOMINAL HYSTERECTOMY    . CARDIAC CATHETERIZATION N/A 05/13/2016   Procedure: Left Heart Cath and Coronary Angiography;  Surgeon: Corky Crafts, MD;  Location: [facility] INVASIVE CV LAB;  Service: Cardiovascular;  Laterality: N/A;  . CERVICAL DISC SURGERY    . CHOLECYSTECTOMY    . LUMBAR SPINE SURGERY    . TOTAL KNEE ARTHROPLASTY    . TOTAL SHOULDER ARTHROPLASTY Left 06/09/2017   Procedure: Left shoulder anatomic  total shoulder arthroplasty;  Surgeon: Beverely Low, MD;  [facility];  Service: Orthopedics;  Laterality: Left;  . TOTAL SHOULDER REPLACEMENT      There were no vitals filed for this visit.  Subjective Assessment - 11/13/17 0959    Subjective   S: My back is a little better.     Currently in Pain?  Yes    Pain Score  7     Pain Location  Shoulder    Pain Orientation  Left    Pain Descriptors / Indicators  Stabbing;Throbbing    Pain Type  Chronic pain         [facility] OT Assessment - 11/13/17 1002      Assessment   Diagnosis  Left shoulder replacement      Precautions   Precautions  Shoulder    Type of Shoulder Precautions  No lifting anything heavier than a gallon of milk               OT Treatments/Exercises (OP) - 11/13/17 1002      Exercises   Exercises  Shoulder      Shoulder Exercises: Standing   Protraction  PROM;AAROM;10 reps    Horizontal ABduction  PROM;10 reps  External Rotation  PROM;AAROM;10 reps    Internal Rotation  PROM;AAROM;10 reps    Flexion  PROM;AAROM;10 reps    ABduction  PROM;AAROM;10 reps      Shoulder Exercises: Pulleys   Flexion  1 minute    ABduction  1 minute      Shoulder Exercises: Therapy Ball   Flexion  15 reps    ABduction  15 reps      Shoulder Exercises: ROM/Strengthening   Wall Wash  1"      Modalities   Modalities  Moist Heat      Moist Heat Therapy   Number Minutes Moist Heat  10 Minutes    Moist Heat Location  Shoulder      Manual Therapy   Manual Therapy  Myofascial release    Manual therapy comments  Manual therapy completed prior to exercises.    Myofascial Release  Myofascial release and manual stretching completed to left upper arm, trapezious, and scapularis region.                OT Short Term Goals - 10/27/17 1056      OT SHORT TERM GOAL #1   Title  Patient will be educated and independent with HEP to faciliate progress in therapy and increase functional use of LUE during daily  tasks.     Time  6    Period  Weeks    Status  On-going      OT SHORT TERM GOAL #2   Title  Patient will increase P/ROM to WNL to increase ability to complete dressing tasks easier with less compensatory strategies and pain.    Time  6    Period  Weeks    Status  On-going      OT SHORT TERM GOAL #3   Title  Patient will increase LUE strength to 3/5 to increase ability to reach an overhead shelf with less difficulty.     Time  6    Period  Weeks    Status  On-going      OT SHORT TERM GOAL #4   Title  Patient will decrease fascial restrictions in LUE to mod amount in order to increase functional mobility needed to begin reaching above shoulder level.     Time  6    Period  Weeks    Status  On-going      OT SHORT TERM GOAL #5   Title  Patient will decrease pain level when using LUE during daily tasks to 5/10 or less.     Time  6    Period  Weeks    Status  On-going        OT Long Term Goals - 10/27/17 1056      OT LONG TERM GOAL #1   Title  Patient will return to highest level of independence with all daily and leisure tasks using LUE for non-dominant extremity.    Time  12    Period  Weeks    Status  On-going      OT LONG TERM GOAL #2   Title  Patient will increase A/ROM of LUE to [facility] to increase ability to reach into overhead cabinets and overhead for needed tasks.     Time  12    Period  Weeks    Status  On-going      OT LONG TERM GOAL #3   Title  Patient will increase LUE strength to 4/5 in order to return to normal lifting  activities at home.    Time  12    Period  Weeks    Status  On-going      OT LONG TERM GOAL #4   Title  Patient will decrease fascial restrictions in LUE to Min amount or less in order to increase functional mobility needed to complete reaching activities.     Time  12    Period  Weeks    Status  On-going      OT LONG TERM GOAL #5   Title  Patient will decrease pain in LUE during daily tasks to 3/10 or less.    Time  12    Period   Weeks    Status  On-going            Plan - 11/13/17 1026    Clinical Impression Statement  A: HEP was upgraded to AA/ROM. patient is unable to complete horizontal abduction at this time due to pain in left anterior deltoid. Added pulleys this session. Back pain is a limiting factor during session.    Plan  P: Continue with AA/ROM exercises seated.     Consulted and Agree with Plan of Care  Patient       Patient will benefit from skilled therapeutic intervention in order to improve the following deficits and impairments:  Increased fascial restrictions, Impaired UE functional use, Pain, Decreased range of motion, Decreased strength  Visit Diagnosis: Other symptoms and signs involving the musculoskeletal system  Chronic left shoulder pain  Stiffness of left shoulder, not elsewhere classified    Problem List Patient Active Problem List   Diagnosis Date Noted  . Chest pain 06/29/2017  . Chest pain in adult 06/29/2017  . 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 Accelerating angina (HCC)   . Back pain 12/06/2011  . COPD exacerbation (HCC) 12/05/2011  . CAP (community acquired pneumonia) 12/05/2011  . Tobacco abuse 12/05/2011  . Obesity 12/05/2011  . Leg swelling 12/05/2011  . Benign hypertension 12/05/2011   Limmie PatriciaLaura Emir Nack, OTR/L,CBIS  [phone]  11/13/2017, 12:14 PM  [facility] [address] [place], [place], [address] Phone: [phone]   Fax:  [phone]  Name: Sherry Ruffingngela H Dagley MRN: [id number] Date of Birth: 07/19/1956

## 2017-11-15 ENCOUNTER — Encounter (HOSPITAL_COMMUNITY): Payer: Self-pay

## 2017-11-15 ENCOUNTER — Ambulatory Visit (HOSPITAL_COMMUNITY): Payer: Medicare Other

## 2017-11-15 DIAGNOSIS — G8929 Other chronic pain: Secondary | ICD-10-CM | POA: Diagnosis not present

## 2017-11-15 DIAGNOSIS — R29898 Other symptoms and signs involving the musculoskeletal system: Secondary | ICD-10-CM

## 2017-11-15 DIAGNOSIS — M25512 Pain in left shoulder: Secondary | ICD-10-CM | POA: Diagnosis not present

## 2017-11-15 DIAGNOSIS — M25612 Stiffness of left shoulder, not elsewhere classified: Secondary | ICD-10-CM

## 2017-11-15 NOTE — Therapy (Signed)
[facility] [address] [place], [place], [address] Phone: [phone]   Fax:  [phone]  Occupational Therapy Treatment  Patient Details  Name: [name] MRN: [id number] Date of Birth: 20-Jun-1956 Referring Provider: Dr. Kari Baars   Encounter Date: 11/15/2017  OT End of Session - 11/15/17 1021    Visit Number  5    Number of Visits  24    Date for OT Re-Evaluation  12/22/17 mini reassess: 11/20/17    Authorization Type  1) UHC medicare 2) medicaid    Authorization Time Period  before 10th visit    Authorization - Visit Number  5    Authorization - Number of Visits  10    OT Start Time  [phone]    OT Stop Time  1030    OT Time Calculation (min)  43 min    Activity Tolerance  Patient tolerated treatment well    Behavior During Therapy  [facility] for tasks assessed/performed       Past Medical History:  Diagnosis Date  . Anxiety   . Asthma   . Chronic back pain   . COPD (chronic obstructive pulmonary disease) (HCC)   . Essential hypertension   . Fibromyalgia   . Gout   . History of cardiac catheterization    [place] - recalls having "70%" stenosis in an artery  . History of colonic polyps   . History of pulmonary embolus (PE)    Postoperative 2011  . Hyperlipidemia   . Hypothyroidism    patient denies - not on medicaion for that   . Osteoarthritis   . Prediabetes    patient denies  . Stress incontinence     Past Surgical History:  Procedure Laterality Date  . ABDOMINAL HYSTERECTOMY    . CARDIAC CATHETERIZATION N/A 05/13/2016   Procedure: Left Heart Cath and Coronary Angiography;  Surgeon: Corky Crafts, MD;  Location: [facility] INVASIVE CV LAB;  Service: Cardiovascular;  Laterality: N/A;  . CERVICAL DISC SURGERY    . CHOLECYSTECTOMY    . LUMBAR SPINE SURGERY    . TOTAL KNEE ARTHROPLASTY    . TOTAL SHOULDER ARTHROPLASTY Left 06/09/2017   Procedure: Left shoulder anatomic total shoulder arthroplasty;   Surgeon: Beverely Low, MD;  Location: [facility] OR;  Service: Orthopedics;  Laterality: Left;  . TOTAL SHOULDER REPLACEMENT      There were no vitals filed for this visit.  Subjective Assessment - 11/15/17 1007    Subjective   S: I can't do anything the next day after I leave here.     Currently in Pain?  Yes    Pain Score  7     Pain Location  Shoulder    Pain Orientation  Left    Pain Descriptors / Indicators  Stabbing;Throbbing    Pain Type  Chronic pain         [facility] OT Assessment - 11/15/17 1008      Assessment   Diagnosis  Left shoulder replacement      Precautions   Precautions  Shoulder    Type of Shoulder Precautions  No lifting anything heavier than a gallon of milk               OT Treatments/Exercises (OP) - 11/15/17 1003      Exercises   Exercises  Shoulder      Shoulder Exercises: Seated   Protraction  PROM;AAROM;10 reps    Horizontal ABduction  PROM;10 reps  External Rotation  PROM;AAROM;10 reps    Internal Rotation  PROM;AAROM;10 reps    Flexion  PROM;AAROM;10 reps    Abduction  PROM;AAROM;10 reps      Shoulder Exercises: Pulleys   Flexion  1 minute    ABduction  1 minute      Shoulder Exercises: Therapy Ball   Flexion  15 reps    ABduction  15 reps      Shoulder Exercises: ROM/Strengthening   Wall Wash  1'    Thumb Tacks  1'      Modalities   Modalities  Moist Heat      Moist Heat Therapy   Number Minutes Moist Heat  10 Minutes    Moist Heat Location  Shoulder               OT Short Term Goals - 10/27/17 1056      OT SHORT TERM GOAL #1   Title  Patient will be educated and independent with HEP to faciliate progress in therapy and increase functional use of LUE during daily tasks.     Time  6    Period  Weeks    Status  On-going      OT SHORT TERM GOAL #2   Title  Patient will increase P/ROM to WNL to increase ability to complete dressing tasks easier with less compensatory strategies and pain.    Time  6    Period   Weeks    Status  On-going      OT SHORT TERM GOAL #3   Title  Patient will increase LUE strength to 3/5 to increase ability to reach an overhead shelf with less difficulty.     Time  6    Period  Weeks    Status  On-going      OT SHORT TERM GOAL #4   Title  Patient will decrease fascial restrictions in LUE to mod amount in order to increase functional mobility needed to begin reaching above shoulder level.     Time  6    Period  Weeks    Status  On-going      OT SHORT TERM GOAL #5   Title  Patient will decrease pain level when using LUE during daily tasks to 5/10 or less.     Time  6    Period  Weeks    Status  On-going        OT Long Term Goals - 10/27/17 1056      OT LONG TERM GOAL #1   Title  Patient will return to highest level of independence with all daily and leisure tasks using LUE for non-dominant extremity.    Time  12    Period  Weeks    Status  On-going      OT LONG TERM GOAL #2   Title  Patient will increase A/ROM of LUE to [facility] to increase ability to reach into overhead cabinets and overhead for needed tasks.     Time  12    Period  Weeks    Status  On-going      OT LONG TERM GOAL #3   Title  Patient will increase LUE strength to 4/5 in order to return to normal lifting activities at home.    Time  12    Period  Weeks    Status  On-going      OT LONG TERM GOAL #4   Title  Patient will decrease fascial restrictions in  LUE to Min amount or less in order to increase functional mobility needed to complete reaching activities.     Time  12    Period  Weeks    Status  On-going      OT LONG TERM GOAL #5   Title  Patient will decrease pain in LUE during daily tasks to 3/10 or less.    Time  12    Period  Weeks    Status  On-going            Plan - 11/15/17 1022    Clinical Impression Statement  A: Patient with visible increase in P/ROM this session. Continued with exercise repetitions as previously due to pt report of increased pain for 1 day  following sessions. VC for form and technique.    Plan  P: mini reassessment. D/C AA/ROM horizontal abduction at this time as it causes increased back pain.       Patient will benefit from skilled therapeutic intervention in order to improve the following deficits and impairments:  Increased fascial restrictions, Impaired UE functional use, Pain, Decreased range of motion, Decreased strength  Visit Diagnosis: Chronic left shoulder pain  Stiffness of left shoulder, not elsewhere classified  Other symptoms and signs involving the musculoskeletal system    Problem List Patient Active Problem List   Diagnosis Date Noted  . Chest pain 06/29/2017  . Chest pain in adult 06/29/2017  . 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 Accelerating angina (HCC)   . Back pain 12/06/2011  . COPD exacerbation (HCC) 12/05/2011  . CAP (community acquired pneumonia) 12/05/2011  . Tobacco abuse 12/05/2011  . Obesity 12/05/2011  . Leg swelling 12/05/2011  . Benign hypertension 12/05/2011   Limmie PatriciaLaura Marlowe Lawes, OTR/L,CBIS  [phone]  11/15/2017, 10:24 AM  [facility] [address] [place], [place], [address] Phone: [phone]   Fax:  [phone]  Name: [name] MRN: [id number] Date of Birth: 03/09/1956

## 2017-11-20 ENCOUNTER — Ambulatory Visit (HOSPITAL_COMMUNITY): Payer: Medicare Other

## 2017-11-21 ENCOUNTER — Telehealth (HOSPITAL_COMMUNITY): Payer: Self-pay | Admitting: General Practice

## 2017-11-21 NOTE — Telephone Encounter (Signed)
11/21/17  pt called and said she wouldn't be here on Wed

## 2017-11-22 ENCOUNTER — Encounter (HOSPITAL_COMMUNITY): Payer: Medicare Other

## 2017-11-27 ENCOUNTER — Ambulatory Visit (HOSPITAL_COMMUNITY): Payer: Medicare Other

## 2017-11-27 DIAGNOSIS — G8929 Other chronic pain: Secondary | ICD-10-CM

## 2017-11-27 DIAGNOSIS — R29898 Other symptoms and signs involving the musculoskeletal system: Secondary | ICD-10-CM

## 2017-11-27 DIAGNOSIS — M25512 Pain in left shoulder: Secondary | ICD-10-CM | POA: Diagnosis not present

## 2017-11-27 DIAGNOSIS — M25612 Stiffness of left shoulder, not elsewhere classified: Secondary | ICD-10-CM | POA: Diagnosis not present

## 2017-11-27 NOTE — Therapy (Addendum)
[place], [place], [address] Phone: [phone]   Fax:  [phone]  Occupational Therapy Treatment  Patient Details  Name: [name] MRN: [id number] Date of Birth: 07/23/1956 Referring Provider (Historical): Dr. Sinda Du   Encounter Date: 11/27/2017  OT End of Session - 11/27/17 1051    Visit Number  6    Number of Visits  24    Date for OT Re-Evaluation  12/22/17    Authorization Type  1) [employer] medicare 2) medicaid    Authorization Time Period  before 16th visit    Authorization - Visit Number  6    Authorization - Number of Visits  16    OT Start Time  [phone]    OT Stop Time  1035    OT Time Calculation (min)  46 min    Activity Tolerance  Patient tolerated treatment well    Behavior During Therapy  [facility] for tasks assessed/performed       Past Medical History:  Diagnosis Date  . Anxiety   . Asthma   . Chronic back pain   . COPD (chronic obstructive pulmonary disease) ([place])   . Essential hypertension   . Fibromyalgia   . Gout   . History of cardiac catheterization    [place] - recalls having "70%" stenosis in an artery  . History of colonic polyps   . History of pulmonary embolus (PE)    Postoperative 2011  . Hyperlipidemia   . Hypothyroidism    patient denies - not on medicaion for that   . Osteoarthritis   . Prediabetes    patient denies  . Stress incontinence     Past Surgical History:  Procedure Laterality Date  . ABDOMINAL HYSTERECTOMY    . CARDIAC CATHETERIZATION N/A 05/13/2016   Procedure: Left Heart Cath and Coronary Angiography;  Surgeon: Jettie Booze, MD;  Location: [facility];  Service: Cardiovascular;  Laterality: N/A;  . CERVICAL DISC SURGERY    . CHOLECYSTECTOMY    . LUMBAR SPINE SURGERY    . TOTAL KNEE ARTHROPLASTY    . TOTAL SHOULDER ARTHROPLASTY Left 06/09/2017   Procedure: Left shoulder anatomic total shoulder arthroplasty;  Surgeon: Netta Cedars, MD;  Location: [facility];  Service: Orthopedics;  Laterality: Left;  . TOTAL SHOULDER REPLACEMENT      There were no vitals filed for this visit.  Subjective Assessment - 11/27/17 1052    Subjective   S: Not coming for a week has really set me back    Special Tests  FOTO score: 47/100    Pain Score  6     Pain Location  Shoulder    Pain Orientation  Left    Pain Descriptors / Indicators  Stabbing;Throbbing;Constant    Pain Type  Chronic pain    Pain Radiating Towards  up neck and shoulder blade    Pain Onset  1 to 4 weeks ago    Pain Frequency  Intermittent    Aggravating Factors   movement and use    Pain Relieving Factors  pain medication, hot packs, hot water in shower.    Effect of Pain on Daily Activities  mod-severe effect    Multiple Pain Sites  No         [facility] OT Assessment - 11/27/17 0953      Assessment   Medical Diagnosis  Left shoulder replacement    Referring Provider  Dr. Sinda Du  Onset Date/Surgical Date  06/09/17      Precautions   Precautions  Shoulder    Type of Shoulder Precautions  No lifting anything heavier than a gallon of milk      AROM   Overall AROM Comments  Assessed seated. IR/er adducted. Assessed semi supine previously.    AROM Assessment Site  Shoulder    Right/Left Shoulder  Left    Left Shoulder Flexion  95 Degrees previous: 40    Left Shoulder ABduction  85 Degrees previous: 60    Left Shoulder Internal Rotation  90 Degrees previous: 80    Left Shoulder External Rotation  64 Degrees previous: 43      PROM   Overall PROM Comments  Assessed supine. IR/er adducted. Assessed semi supine previously.    PROM Assessment Site  Shoulder    Right/Left Shoulder  Left    Left Shoulder Flexion  125 Degrees previous: 85    Left Shoulder ABduction  90 Degrees previous; 84    Left Shoulder Internal Rotation  90 Degrees previous: same    Left Shoulder External Rotation  50 Degrees previous: same      Strength   Overall Strength  Comments  Assessed seated. IR/er adducted.     Strength Assessment Site  Shoulder    Right/Left Shoulder  Left    Left Shoulder Flexion  3-/5 previous: same    Left Shoulder ABduction  3-/5 previous: 3-/5    Left Shoulder Internal Rotation  5/5 previous: 3/5    Left Shoulder External Rotation  5/5 previous: 3-/5               OT Treatments/Exercises (OP) - 11/27/17 1015      Exercises   Exercises  Shoulder      Shoulder Exercises: ROM/Strengthening   Other ROM/Strengthening Exercises  PVC pipe slide; 5X      Modalities   Modalities  Moist Heat      Moist Heat Therapy   Number Minutes Moist Heat  10 Minutes    Moist Heat Location  Shoulder             OT Education - 11/27/17 1017    Education provided  Yes    Education Details  Reviewed goals. Pt was given another print out of HEP for AA/ROM as she has lost her previous one,    Person(s) Educated  Patient    Methods  Explanation    Comprehension  Verbalized understanding       OT Short Term Goals - 11/27/17 1022      OT SHORT TERM GOAL #1   Title  Patient will be educated and independent with HEP to faciliate progress in therapy and increase functional use of LUE during daily tasks.     Time  6    Period  Weeks    Status  Achieved      OT SHORT TERM GOAL #2   Title  Patient will increase P/ROM to WNL to increase ability to complete dressing tasks easier with less compensatory strategies and pain.    Time  6    Period  Weeks    Status  On-going      OT SHORT TERM GOAL #3   Title  Patient will increase LUE strength to 3/5 to increase ability to reach an overhead shelf with less difficulty.     Time  6    Period  Weeks    Status  On-going  OT SHORT TERM GOAL #4   Title  Patient will decrease fascial restrictions in LUE to mod amount in order to increase functional mobility needed to begin reaching above shoulder level.     Time  6    Period  Weeks    Status  On-going      OT SHORT TERM GOAL  #5   Title  Patient will decrease pain level when using LUE during daily tasks to 5/10 or less.     Time  6    Period  Weeks    Status  On-going        OT Long Term Goals - 10/27/17 1056      OT LONG TERM GOAL #1   Title  Patient will return to highest level of independence with all daily and leisure tasks using LUE for non-dominant extremity.    Time  12    Period  Weeks    Status  On-going      OT LONG TERM GOAL #2   Title  Patient will increase A/ROM of LUE to [facility] to increase ability to reach into overhead cabinets and overhead for needed tasks.     Time  12    Period  Weeks    Status  On-going      OT LONG TERM GOAL #3   Title  Patient will increase LUE strength to 4/5 in order to return to normal lifting activities at home.    Time  12    Period  Weeks    Status  On-going      OT LONG TERM GOAL #4   Title  Patient will decrease fascial restrictions in LUE to Min amount or less in order to increase functional mobility needed to complete reaching activities.     Time  12    Period  Weeks    Status  On-going      OT LONG TERM GOAL #5   Title  Patient will decrease pain in LUE during daily tasks to 3/10 or less.    Time  12    Period  Weeks    Status  On-going            Plan - Dec 24, 2017 1059    Clinical Impression Statement  A: Mini reassessment completed this date. patient has met 1 short term goal at this time. She has made a lot of progress in 4 weeks. Both active and passive ROM has increased. Pt reports that she is able to do meal prep tasks, donn her bra, and put on her seat belt (with belt resting on the shoulder).  Added PVC pipe slide although patient was only able to complete 5 repetitions versus 10 due to pain. Pain continues to be a deficit and limits patient's ability to progress to A/ROM as this point.      Plan  P: Continue with pvc pipe slide attempting to complete 10 repetitions if able.     Consulted and Agree with Plan of Care  Patient        Patient will benefit from skilled therapeutic intervention in order to improve the following deficits and impairments:  Increased fascial restrictions, Impaired UE functional use, Pain, Decreased range of motion, Decreased strength  Visit Diagnosis: Stiffness of left shoulder, not elsewhere classified  Chronic left shoulder pain  Other symptoms and signs involving the musculoskeletal system  G-Codes - 24-Dec-2017 1104    Functional Assessment Tool Used (Outpatient only)  FOTO score: 47/100 (53%  impaired)    Functional Limitation  Carrying, moving and handling objects    Carrying, Moving and Handling Objects Current Status [phone])  At least 40 percent but less than 60 percent impaired, limited or restricted    Carrying, Moving and Handling Objects Goal Status (T6606)  At least 40 percent but less than 60 percent impaired, limited or restricted       Problem List Patient Active Problem List   Diagnosis Date Noted  . Chest pain 06/29/2017  . Chest pain in adult 06/29/2017  . 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 Accelerating angina ([place])   . Back pain 12/06/2011  . COPD exacerbation ([place]) 12/05/2011  . CAP (community acquired pneumonia) 12/05/2011  . Tobacco abuse 12/05/2011  . Obesity 12/05/2011  . Leg swelling 12/05/2011  . Benign hypertension 12/05/2011   Ailene Ravel, OTR/L,CBIS  [phone]  11/27/2017, 11:11 AM  [facility] [address] [place], [place], [address] Phone: [phone]   Fax:  [phone]  Name: CAROL THEYS MRN: [id number] Date of Birth: May 17, 1956   OCCUPATIONAL THERAPY DISCHARGE SUMMARY (12/27/17)  Visits from Start of Care: 6  Current functional level related to goals / functional outcomes: Discharge completed on 12/27/17 per patient's request via phone. Pt relayed to front office staff that she would like to be discharged and she felt that her therapist had done all she can.  Reassessment was last completed on 11/27/17. Patient at that time had met only 1 short term goal. She was progressing slowly towards goals. Pt reported that she was completing her HEP at home. She is not able to complete tasks such as meal prep and dressing tasks with decreased difficulty. See above.    Remaining deficits: At the point of her reassessment on 11/27/17, patient was unable to progress to A/ROM. Unable to attempt supine A/ROM as patient reports chronic back pain and unable to tolerate supine position. Patient remains at AA/ROM with functional ROM. Decreased shoulder stability and strength present.   Education / Equipment: AA/ROM, progress in therapy, progress with therapy goals. Plan: Patient agrees to discharge.  Patient goals were not met. Patient is being discharged due to being pleased with the current functional level.  ?????         Ailene Ravel, OTR/L,CBIS  [phone]

## 2017-11-29 ENCOUNTER — Telehealth (HOSPITAL_COMMUNITY): Payer: Self-pay | Admitting: General Practice

## 2017-11-29 ENCOUNTER — Ambulatory Visit (HOSPITAL_COMMUNITY): Payer: Medicare Other

## 2017-11-29 NOTE — Telephone Encounter (Signed)
11/29/17  patient called and said she wouldn't be here

## 2017-12-11 ENCOUNTER — Ambulatory Visit (HOSPITAL_COMMUNITY): Payer: Medicare Other

## 2017-12-13 ENCOUNTER — Ambulatory Visit (HOSPITAL_COMMUNITY): Payer: Medicare Other | Attending: Pulmonary Disease

## 2017-12-13 ENCOUNTER — Telehealth (HOSPITAL_COMMUNITY): Payer: Self-pay | Admitting: General Practice

## 2017-12-13 NOTE — Telephone Encounter (Signed)
12/13/17  9:04am patient called to say she was sick, coughing and hacking, that is why she didn't come to her appt today.

## 2017-12-18 ENCOUNTER — Ambulatory Visit (HOSPITAL_COMMUNITY): Payer: Medicare Other | Admitting: Specialist

## 2017-12-18 ENCOUNTER — Encounter (HOSPITAL_COMMUNITY): Payer: Self-pay

## 2017-12-18 ENCOUNTER — Telehealth (HOSPITAL_COMMUNITY): Payer: Self-pay | Admitting: General Practice

## 2017-12-18 NOTE — Telephone Encounter (Signed)
12/18/17  Patient was checked in but when Beth came to take her back she asked where [name] was.  She said that there was a strict stipulation that she would only see [name].  Beth asked that I cancel the checkin and patient wasn't seen.

## 2017-12-20 ENCOUNTER — Ambulatory Visit (HOSPITAL_COMMUNITY): Payer: Medicare Other

## 2017-12-25 ENCOUNTER — Ambulatory Visit (HOSPITAL_COMMUNITY): Payer: Medicare Other

## 2017-12-27 ENCOUNTER — Telehealth (HOSPITAL_COMMUNITY): Payer: Self-pay

## 2017-12-27 ENCOUNTER — Ambulatory Visit (HOSPITAL_COMMUNITY): Payer: Medicare Other

## 2017-12-27 NOTE — Telephone Encounter (Signed)
Called patient regarding no show. No answer and message was left on voicemail. Also informed patient that she has had 4 no shows at this time (12/27/17. 12/25/17, 12/20/17, 12/11/17) and that typically we would discharge from therapy per our attendance policy. Therapist will not discharge patient at this time unless she has another no show. Pt was reminded on her next appointment and to call if she is unable to attend.  Limmie PatriciaLaura Essenmacher, OTR/L,CBIS  [phone]

## 2017-12-27 NOTE — Telephone Encounter (Signed)
Pt called back stating that she called yesterday and requested to be D/C that she felt Vernona RiegerLaura had done a good job and that she had gone as far as she could go. She was upset by the message that was left and stated that words hurt peoples feelings.

## 2018-01-01 ENCOUNTER — Encounter (HOSPITAL_COMMUNITY): Payer: Medicare Other

## 2018-01-03 ENCOUNTER — Encounter (HOSPITAL_COMMUNITY): Payer: Medicare Other

## 2018-01-10 DIAGNOSIS — J449 Chronic obstructive pulmonary disease, unspecified: Secondary | ICD-10-CM | POA: Diagnosis not present

## 2018-01-10 DIAGNOSIS — I10 Essential (primary) hypertension: Secondary | ICD-10-CM | POA: Diagnosis not present

## 2018-01-10 DIAGNOSIS — Z86711 Personal history of pulmonary embolism: Secondary | ICD-10-CM | POA: Diagnosis not present

## 2018-03-01 ENCOUNTER — Encounter (HOSPITAL_COMMUNITY): Payer: Self-pay | Admitting: Emergency Medicine

## 2018-03-01 ENCOUNTER — Other Ambulatory Visit: Payer: Self-pay

## 2018-03-01 ENCOUNTER — Emergency Department (HOSPITAL_COMMUNITY)
Admission: EM | Admit: 2018-03-01 | Discharge: 2018-03-01 | Disposition: A | Discharge status: Home / Self Care | Payer: Medicare Other | Attending: Emergency Medicine | Admitting: Emergency Medicine

## 2018-03-01 ENCOUNTER — Emergency Department (HOSPITAL_COMMUNITY): Payer: Medicare Other

## 2018-03-01 DIAGNOSIS — Z96659 Presence of unspecified artificial knee joint: Secondary | ICD-10-CM | POA: Insufficient documentation

## 2018-03-01 DIAGNOSIS — F17210 Nicotine dependence, cigarettes, uncomplicated: Secondary | ICD-10-CM | POA: Diagnosis not present

## 2018-03-01 DIAGNOSIS — J449 Chronic obstructive pulmonary disease, unspecified: Secondary | ICD-10-CM | POA: Insufficient documentation

## 2018-03-01 DIAGNOSIS — Z91040 Latex allergy status: Secondary | ICD-10-CM | POA: Diagnosis not present

## 2018-03-01 DIAGNOSIS — Z79899 Other long term (current) drug therapy: Secondary | ICD-10-CM | POA: Insufficient documentation

## 2018-03-01 DIAGNOSIS — I10 Essential (primary) hypertension: Secondary | ICD-10-CM | POA: Diagnosis not present

## 2018-03-01 DIAGNOSIS — E039 Hypothyroidism, unspecified: Secondary | ICD-10-CM | POA: Diagnosis not present

## 2018-03-01 DIAGNOSIS — R079 Chest pain, unspecified: Secondary | ICD-10-CM | POA: Diagnosis not present

## 2018-03-01 DIAGNOSIS — Z96612 Presence of left artificial shoulder joint: Secondary | ICD-10-CM | POA: Insufficient documentation

## 2018-03-01 DIAGNOSIS — J45909 Unspecified asthma, uncomplicated: Secondary | ICD-10-CM | POA: Diagnosis not present

## 2018-03-01 LAB — COMPREHENSIVE METABOLIC PANEL
ALBUMIN: 4 g/dL (ref 3.5–5.0)
ALT: 18 U/L (ref 14–54)
ANION GAP: 12 (ref 5–15)
AST: 19 U/L (ref 15–41)
Alkaline Phosphatase: 64 U/L (ref 38–126)
BILIRUBIN TOTAL: 0.6 mg/dL (ref 0.3–1.2)
BUN: 17 mg/dL (ref 6–20)
CHLORIDE: 98 mmol/L — AB (ref 101–111)
CO2: 24 mmol/L (ref 22–32)
Calcium: 9.2 mg/dL (ref 8.9–10.3)
Creatinine, Ser: 0.67 mg/dL (ref 0.44–1.00)
GFR calc Af Amer: >60 mL/min (ref >60)
GFR calc non Af Amer: >60 mL/min (ref >60)
GLUCOSE: 109 mg/dL — AB (ref 65–99)
POTASSIUM: 4.1 mmol/L (ref 3.5–5.1)
SODIUM: 134 mmol/L — AB (ref 135–145)
TOTAL PROTEIN: 7.9 g/dL (ref 6.5–8.1)

## 2018-03-01 LAB — TROPONIN I: Troponin I: <0.03 ng/mL (ref <0.03)

## 2018-03-01 LAB — D-DIMER, QUANTITATIVE: D-Dimer, Quant: 2.13 ug/mL-FEU — ABNORMAL HIGH (ref 0.00–0.50)

## 2018-03-01 LAB — CBC WITH DIFFERENTIAL/PLATELET
BASOS PCT: 0 %
Basophils Absolute: 0 10*3/uL (ref 0.0–0.1)
Eosinophils Absolute: 0.1 10*3/uL (ref 0.0–0.7)
Eosinophils Relative: 1 %
HCT: 48.6 % — ABNORMAL HIGH (ref 36.0–46.0)
HEMOGLOBIN: 15.9 g/dL — AB (ref 12.0–15.0)
Lymphocytes Relative: 21 %
Lymphs Abs: 2.2 10*3/uL (ref 0.7–4.0)
MCH: 29.3 pg (ref 26.0–34.0)
MCHC: 32.7 g/dL (ref 30.0–36.0)
MCV: 89.7 fL (ref 78.0–100.0)
MONOS PCT: 8 %
Monocytes Absolute: 0.9 10*3/uL (ref 0.1–1.0)
NEUTROS ABS: 7.3 10*3/uL (ref 1.7–7.7)
NEUTROS PCT: 70 %
Platelets: 277 10*3/uL (ref 150–400)
RBC: 5.42 MIL/uL — AB (ref 3.87–5.11)
RDW: 13.8 % (ref 11.5–15.5)
WBC: 10.6 10*3/uL — ABNORMAL HIGH (ref 4.0–10.5)

## 2018-03-01 MED ORDER — NITROGLYCERIN 2 % TD OINT
1.0000 [in_us] | TOPICAL_OINTMENT | Freq: Once | TRANSDERMAL | Status: AC
  Administered 2018-03-01: 1 [in_us] via TOPICAL
  Filled 2018-03-01: qty 1

## 2018-03-01 MED ORDER — SODIUM CHLORIDE 0.9 % IV BOLUS (SEPSIS)
1000.0000 mL | Freq: Once | INTRAVENOUS | Status: AC
  Administered 2018-03-01: 1000 mL via INTRAVENOUS

## 2018-03-01 MED ORDER — ONDANSETRON HCL 4 MG/2ML IJ SOLN
4.0000 mg | Freq: Once | INTRAMUSCULAR | Status: AC
  Administered 2018-03-01: 4 mg via INTRAVENOUS
  Filled 2018-03-01: qty 2

## 2018-03-01 MED ORDER — ASPIRIN 81 MG PO CHEW
324.0000 mg | CHEWABLE_TABLET | Freq: Once | ORAL | Status: AC
  Administered 2018-03-01: 324 mg via ORAL
  Filled 2018-03-01: qty 4

## 2018-03-01 NOTE — ED Notes (Signed)
Merleen Millinerhristina Kemp informed Dr Fayrene FearingJames that pt want to leave AMA.

## 2018-03-01 NOTE — ED Notes (Signed)
EDP at bedside updating patient. 

## 2018-03-01 NOTE — ED Notes (Signed)
Pt requesting to leave AMA. States she does not want to wait for VQ scan. EDP and nuclear med notified.

## 2018-03-01 NOTE — ED Triage Notes (Signed)
Pt c/o chest pain since yesterday with vomiting starting this am.

## 2018-03-01 NOTE — Discharge Instructions (Signed)
You are leaving before completion of your evaluation against the medical opinion of Dr. Fayrene FearingJames.  You cannot be told with any certainty if you have had heart attack, or blood clot.  You may return at any time for completion of your evaluation if you determine you will stay for completion of tests.  It is recommended that you follow-up with Dr. Juanetta GoslingHawkins as soon as possible

## 2018-03-01 NOTE — ED Provider Notes (Addendum)
MSE was initiated and I personally evaluated the patient and placed orders (if any) at 06:35 AM on March 01, 2018.  The patient appears stable so that the remainder of the MSE may be completed by another provider.  Pt turned over to Dr Genene ChurnM James at 07:05 AM  Patient reports she has a history of coronary artery disease however she has not required a stent yet because her blockages were not big enough.  She also has a history of PE.  She states she woke up from sleep yesterday morning with a central chest pain that she describes as sharp and constant.  She states movement and breathing make it worse.  She denies cough or fever but states she did feel short of breath intermittently that lasts 15-20 seconds at a time.  She denies any pain or swelling of her extremities.  She states this morning she had nausea and she vomited once about 40 minutes prior to arrival.  She states this pain feels similar to when she had her heart problems before.  PCP Kari BaarsHawkins, Edward, MD Cardiology Dr Diona BrownerMcDowell  Patient is alert and cooperative, she is joking with nursing staff, her lungs are clear, her chest wall is nontender to palpation.  Her legs are without edema.   May 13, 2016 Corky CraftsVaranasi, Jayadeep S, MD (Primary)    Procedures   Left Heart Cath and Coronary Angiography  Conclusion    Prox RCA lesion, 50% stenosed.  Mid LAD lesion, 30% stenosed.  Mid Cx lesion, 10% stenosed.  The left ventricular systolic function is normal.   Nonobstructive coronary artery disease. Continue aggressive preventative therapy.      EKG Interpretation  Date/Time:  Thursday March 01 2018 06:39:31 EDT Ventricular Rate:  99 PR Interval:    QRS Duration: 90 QT Interval:  356 QTC Calculation: 457 R Axis:   63 Text Interpretation:  Sinus rhythm Consider left atrial enlargement No significant change since last tracing 28 Jun 2017 Confirmed by Devoria AlbeKnapp, Scarlette Hogston ([id number]) on 03/01/2018 7:07:46 AM      Medications  sodium chloride 0.9 %  bolus 1,000 mL (has no administration in time range)  ondansetron (ZOFRAN) injection 4 mg (has no administration in time range)  aspirin chewable tablet 324 mg (has no administration in time range)  nitroGLYCERIN (NITROGLYN) 2 % ointment 1 inch (has no administration in time range)     Initial orders were written.   Devoria AlbeIva Jasilyn Holderman, MD, Concha PyoFACEP    Jeniece Hannis, MD 03/01/18 Cecilio [id number]    Devoria AlbeKnapp, Carlton Buskey, MD 03/01/18 [phone]

## 2018-03-01 NOTE — ED Provider Notes (Signed)
[facility] EMERGENCY DEPARTMENT Provider Note   CSN: [id number] Arrival date & time: 03/01/18  4098     History   Chief Complaint Chief Complaint  Patient presents with  . Chest Pain    HPI [name] is a 62 y.o. female.  Chief complaint is chest pain  HPI: 62 year old female.  History of nonocclusive coronary artery disease.  History of previous PE after a shoulder surgery.  Complains of chest pain yesterday with vomiting this morning.  Pain is not pressure, not pleuritic, not reproducible to move or palpate.  No recent exertional dyspnea.  No leg swelling.  No prolonged immobilization or recent procedures.  Not currently anticoagulated  Past Medical History:  Diagnosis Date  . Anxiety   . Asthma   . Chronic back pain   . Essential hypertension   . Fibromyalgia   . Gout   . History of cardiac catheterization    [place] - recalls having "70%" stenosis in an artery  . History of colonic polyps   . History of pulmonary embolus (PE)    Postoperative 2011  . Hyperlipidemia   . Hypothyroidism    patient denies - not on medicaion for that   . Osteoarthritis   . Prediabetes    patient denies  . Stress incontinence     Patient Active Problem List   Diagnosis Date Noted  . Chest pain 06/29/2017  . Chest pain in adult 06/29/2017  . 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 Accelerating angina (HCC)   . Back pain 12/06/2011  . COPD exacerbation (HCC) 12/05/2011  . CAP (community acquired pneumonia) 12/05/2011  . Tobacco abuse 12/05/2011  . Obesity 12/05/2011  . Leg swelling 12/05/2011  . Benign hypertension 12/05/2011    Past Surgical History:  Procedure Laterality Date  . ABDOMINAL HYSTERECTOMY    . CARDIAC CATHETERIZATION N/A 05/13/2016   Procedure: Left Heart Cath and Coronary Angiography;  Surgeon: Corky Crafts, MD;  Location: [facility] INVASIVE CV LAB;  Service: Cardiovascular;  Laterality: N/A;  . CERVICAL DISC SURGERY    .  CHOLECYSTECTOMY    . LUMBAR SPINE SURGERY    . TOTAL KNEE ARTHROPLASTY    . TOTAL SHOULDER ARTHROPLASTY Left 06/09/2017   Procedure: Left shoulder anatomic total shoulder arthroplasty;  Surgeon: Beverely Low, MD;  Location: [facility] OR;  Service: Orthopedics;  Laterality: Left;  . TOTAL SHOULDER REPLACEMENT      OB History   None      Home Medications    Prior to Admission medications   Medication Sig Start Date End Date Taking? Authorizing Provider  albuterol (PROVENTIL HFA;VENTOLIN HFA) 108 (90 BASE) MCG/ACT inhaler Inhale 1-2 puffs into the lungs every 6 (six) hours as needed for wheezing or shortness of breath.    Yes [provider]  benazepril (LOTENSIN) 20 MG tablet Take 20 mg by mouth See admin instructions. Usually takes once daily but blood pressure has been running high and pt will take it twice daily as needed 02/06/16  Yes [provider]  EPINEPHrine (EPIPEN 2-PAK) 0.3 mg/0.3 mL IJ SOAJ injection Inject 0.3 mg into the muscle once.   Yes [provider]  OVER THE COUNTER MEDICATION Place 1 application onto the skin as needed.   Yes [provider]  enoxaparin (LOVENOX) 40 MG/0.4ML injection Inject 0.4 mLs (40 mg total) into the skin daily. 30 days post op Patient not taking: Reported on 10/23/2017 06/10/17   Beverely Low, MD  esomeprazole (NEXIUM) 40 MG  packet Take 40 mg daily before breakfast by mouth.    [provider]  HYDROcodone-acetaminophen (NORCO) 7.5-325 MG tablet Take 1 tablet every 6 (six) hours as needed by mouth for moderate pain.    [provider]  meperidine (DEMEROL) 50 MG tablet Take 50 mg by mouth every 6 (six) hours as needed for severe pain.    [provider]  methocarbamol (ROBAXIN) 500 MG tablet Take 500 mg by mouth every 6 (six) hours as needed for muscle spasms.    [provider]  oxyCODONE-acetaminophen (PERCOCET) 10-325 MG tablet Take 1 tablet every 4 (four) hours as needed by  mouth for pain.    [provider]  promethazine (PHENERGAN) 50 MG tablet Take 50 mg by mouth every 6 (six) hours as needed for nausea or vomiting.    [provider]  tizanidine (ZANAFLEX) 2 MG capsule Take 2 mg 3 (three) times daily by mouth.    [provider]    Family History Family History  Problem Relation Age of Onset  . Colon cancer Maternal Aunt   . Lung disease Father     Social History Social History   Tobacco Use  . Smoking status: Current Every Day Smoker    Packs/day: 0.50    Years: 30.00    Pack years: 15.00    Types: Cigarettes    Last attempt to quit: 02/10/2016    Years since quitting: 2.0  . Smokeless tobacco: Never Used  Substance Use Topics  . Alcohol use: No    Alcohol/week: 0.0 oz  . Drug use: No     Allergies   Bee venom; Cephalexin; Codeine; Flexeril [cyclobenzaprine]; Iodine; Latex; Lidocaine; Morphine; Neurontin [gabapentin]; Oxycodone; Penicillins; Wellbutrin [bupropion hcl]; Gluten; Elavil [amitriptyline hcl]; and Tobramycin   Review of Systems Review of Systems  Constitutional: Negative for appetite change, chills, diaphoresis, fatigue and fever.  HENT: Negative for mouth sores, sore throat and trouble swallowing.   Eyes: Negative for visual disturbance.  Respiratory: Negative for cough, chest tightness, shortness of breath and wheezing.   Cardiovascular: Positive for chest pain.  Gastrointestinal: Negative for abdominal distention, abdominal pain, diarrhea, nausea and vomiting.  Endocrine: Negative for polydipsia, polyphagia and polyuria.  Genitourinary: Negative for dysuria, frequency and hematuria.  Musculoskeletal: Negative for gait problem.  Skin: Negative for color change, pallor and rash.  Neurological: Negative for dizziness, syncope, light-headedness and headaches.  Hematological: Does not bruise/bleed easily.  Psychiatric/Behavioral: Negative for behavioral problems and confusion.     Physical  Exam Updated Vital Signs BP 126/67   Pulse 85   Temp 97.7 F (36.5 C)   Resp (!) 22   Ht 5\' 9"  (1.753 m)   Wt 113.4 kg (250 lb)   SpO2 93%   BMI 36.92 kg/m   Physical Exam  Constitutional: She is oriented to person, place, and time. She appears well-developed and well-nourished. No distress.  HENT:  Head: Normocephalic.  Eyes: Pupils are equal, round, and reactive to light. Conjunctivae are normal. No scleral icterus.  Neck: Normal range of motion. Neck supple. No thyromegaly present.  Cardiovascular: Normal rate and regular rhythm. Exam reveals no gallop and no friction rub.  No murmur heard. Pulmonary/Chest: Effort normal and breath sounds normal. No respiratory distress. She has no wheezes. She has no rales.  Clear lungs.  Chest wall nontender.  No pleural, or flare cardio friction rubs.  No peripheral edema or asymmetry of the lower extremities.  Abdominal: Soft. Bowel sounds are normal. She  exhibits no distension. There is no tenderness. There is no rebound.  Musculoskeletal: Normal range of motion.  Neurological: She is alert and oriented to person, place, and time.  Skin: Skin is warm and dry. No rash noted.  Psychiatric: She has a normal mood and affect. Her behavior is normal.     ED Treatments / Results  Labs (all labs ordered are listed, but only abnormal results are displayed) Labs Reviewed  COMPREHENSIVE METABOLIC PANEL - Abnormal; Notable for the following components:      Result Value   Sodium 134 (*)    Chloride 98 (*)    Glucose, Bld 109 (*)    All other components within normal limits  CBC WITH DIFFERENTIAL/PLATELET - Abnormal; Notable for the following components:   WBC 10.6 (*)    RBC 5.42 (*)    Hemoglobin 15.9 (*)    HCT 48.6 (*)    All other components within normal limits  D-DIMER, QUANTITATIVE (NOT AT [facility]) - Abnormal; Notable for the following components:   D-Dimer, Quant 2.13 (*)    All other components within normal limits  TROPONIN I     EKG  EKG Interpretation  Date/Time:  Thursday March 01 2018 06:39:31 EDT Ventricular Rate:  99 PR Interval:    QRS Duration: 90 QT Interval:  356 QTC Calculation: 457 R Axis:   63 Text Interpretation:  Sinus rhythm Consider left atrial enlargement No significant change since last tracing 28 Jun 2017 Confirmed by Devoria AlbeKnapp, Iva ([id number]) on 03/01/2018 7:07:46 AM       Radiology Dg Chest 2 View  Result Date: 03/01/2018 CLINICAL DATA:  Chest pain. EXAM: CHEST - 2 VIEW COMPARISON:  Chest x-ray 06/29/2017. FINDINGS: Mediastinum and hilar structures normal. Lungs are clear. No pleural effusion or pneumothorax. Heart size normal. Prior shoulder replacements. IMPRESSION: No acute abnormality. Electronically Signed   By: Maisie Fushomas  Register   On: 03/01/2018 08:14    Procedures Procedures (including critical care time)  Medications Ordered in ED Medications  sodium chloride 0.9 % bolus 1,000 mL (0 mLs Intravenous Stopped 03/01/18 0915)  ondansetron (ZOFRAN) injection 4 mg (4 mg Intravenous Given 03/01/18 0722)  aspirin chewable tablet 324 mg (324 mg Oral Given 03/01/18 0722)  nitroGLYCERIN (NITROGLYN) 2 % ointment 1 inch (1 inch Topical Given 03/01/18 [id number])     Initial Impression / Assessment and Plan / ED Course  I have reviewed the triage vital signs and the nursing notes.  Pertinent labs & imaging results that were available during my care of the patient were reviewed by me and considered in my medical decision making (see chart for details).      EKG Interpretation  Date/Time:  Thursday March 01 2018 06:39:31 EDT Ventricular Rate:  99 PR Interval:    QRS Duration: 90 QT Interval:  356 QTC Calculation: 457 R Axis:   63 Text Interpretation:  Sinus rhythm Consider left atrial enlargement No significant change since last tracing 28 Jun 2017 Confirmed by Devoria AlbeKnapp, Iva ([id number]) on 03/01/2018 7:07:46 AM      X-ray without acute abnormality.  EKG without acute findings.  Initial troponin  normal.  Pain resolved.  Her d-dimer is elevated.  She has contrast allergy.  V/Q ordered.  I discussed with her that this would help to decide whether or not she would require admission or anticoagulation.  I discussed with her that this would take 2 hours to perform based on my conversation with radiology.  After 30 minutes patient left the department  AGAINST MEDICAL ADVICE.  Nurses were informed.  I was in the care of a critical patient unable to speak with her prior to her leaving against my advice.  Was able to put in written form that the patient could return at any time and that tach could not ensure this was not ACS without repeat troponin, and that I could not rule out pulmonary embolus without advanced imaging.  Final Clinical Impressions(s) / ED Diagnoses   Final diagnoses:  Chest pain, unspecified type    ED Discharge Orders    None       Rolland Porter, MD 03/01/18 1440

## 2018-03-01 NOTE — ED Notes (Signed)
Attempted IV X2 

## 2018-03-07 IMAGING — NM NM PULMONARY VENT & PERF
16 series · 16 of 16 positions shown · non-contrast
Comparison: None

Correlation: Chest radiograph 06/29/2017

CLINICAL DATA: Chest pain, history COPD, hypertension

EXAM:
NUCLEAR MEDICINE VENTILATION - PERFUSION LUNG SCAN
TECHNIQUE: Ventilation images were obtained in multiple projections using
inhaled aerosol 6c-LLm DTPA. Perfusion images were obtained in
multiple projections after intravenous injection of 6c-LLm MAA.
RADIOPHARMACEUTICALS:  33 mCi Bechnetium-YYm DTPA aerosol inhalation
and 4.4 mCi Bechnetium-YYm MAA IV

[Series 1: ant/post vent · 4.14mm/px · 1 of 1 slices shown (1 of 2)]
[im 1/1]
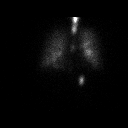

[Series 1: ant/post vent · 4.14mm/px · 1 of 1 slices shown (2 of 2)]
[im 1/1]
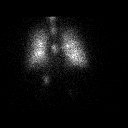

[Series 2: lao/rpo vent · 4.14mm/px · 1 of 1 slices shown (1 of 2)]
[im 1/1]
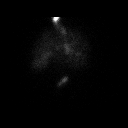

[Series 2: lao/rpo vent · 4.14mm/px · 1 of 1 slices shown (2 of 2)]
[im 1/1]
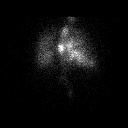

[Series 3: lt lat/rt lat vent · 4.14mm/px · 1 of 1 slices shown (1 of 2)]
[im 1/1]
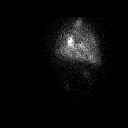

[Series 3: lt lat/rt lat vent · 4.14mm/px · 1 of 1 slices shown (2 of 2)]
[im 1/1  full-range]
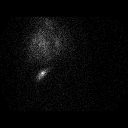

[Series 4: lpo/rao vent · 4.14mm/px · 1 of 1 slices shown (1 of 2)]
[im 1/1]
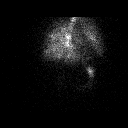

[Series 4: lpo/rao vent · 4.14mm/px · 1 of 1 slices shown (2 of 2)]
[im 1/1]
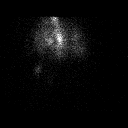

[Series 5: ant/post perf · 4.14mm/px · 1 of 1 slices shown (1 of 2)]
[im 1/1]
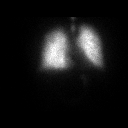

[Series 5: ant/post perf · 4.14mm/px · 1 of 1 slices shown (2 of 2)]
[im 1/1]
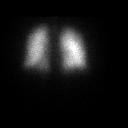

[Series 6: lao/rpo perf · 4.14mm/px · 1 of 1 slices shown (1 of 2)]
[im 1/1]
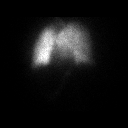

[Series 6: lao/rpo perf · 4.14mm/px · 1 of 1 slices shown (2 of 2)]
[im 1/1]
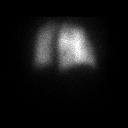

[Series 7: lt lat/rt lat perf · 4.14mm/px · 1 of 1 slices shown (1 of 2)]
[im 1/1]
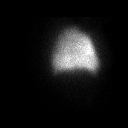

[Series 7: lt lat/rt lat perf · 4.14mm/px · 1 of 1 slices shown (2 of 2)]
[im 1/1]
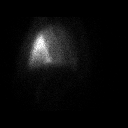

[Series 8: lpo/rao perf · 4.14mm/px · 1 of 1 slices shown (1 of 2)]
[im 1/1]
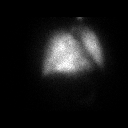

[Series 8: lpo/rao perf · 4.14mm/px · 1 of 1 slices shown (2 of 2)]
[im 1/1]
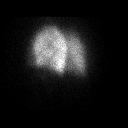

[16 of 16 positions shown; findings below may reference images not displayed]

FINDINGS: Ventilation: Central airway deposition of tracer. Swallowed aerosol
and stomach. No definite ventilatory defects.

Perfusion: Normal

Chest radiograph:  Clear lungs
IMPRESSION: Normal ventilation and perfusion lung scan.

## 2018-04-10 ENCOUNTER — Encounter: Payer: Self-pay | Admitting: Cardiology

## 2018-04-10 NOTE — Progress Notes (Signed)
Cardiology Office Note  Date: 04/11/2018   ID: [name], DOB [birth year], MRN [id number]  PCP: Kari Baars, MD  Primary Cardiologist: Nona Dell, MD   Chief Complaint  Patient presents with  . Coronary Artery Disease    History of Present Illness: [name] is a 61 y.o. female last seen by Ms. Liborio Nixon in June 2018.  She presents for a routine follow-up visit.  She does not report any angina symptoms or unusual shortness of breath with typical activities.  She states that she is still recuperating from left shoulder surgery done in June of last year.  She maintains follow-up with Dr. Juanetta Gosling.  I reviewed her current medications.  She is on Lotensin for treatment of hypertension.  We also discussed using a low-dose baby aspirin daily.  No clear indication for follow-up ischemic testing at this time.  She had mild to moderate nonobstructive CAD at cardiac catheterization in 2017.  I did ask her to make sure she is following up on lipids with Dr. Juanetta Gosling.  I talked with her about potential statin therapy for further risk reduction, but she states that she would rather use essential oils.  Past Medical History:  Diagnosis Date  . Anxiety   . Asthma   . CAD (coronary artery disease)    Nonobstructive at cardiac catheterization 2017  . Chronic back pain   . Essential hypertension   . Fibromyalgia   . Gout   . History of colonic polyps   . History of pulmonary embolus (PE)    Postoperative 2011  . Hyperlipidemia   . Osteoarthritis   . Stress incontinence     Past Surgical History:  Procedure Laterality Date  . ABDOMINAL HYSTERECTOMY    . CARDIAC CATHETERIZATION N/A 05/13/2016   Procedure: Left Heart Cath and Coronary Angiography;  Surgeon: Corky Crafts, MD;  Location: [facility] INVASIVE CV LAB;  Service: Cardiovascular;  Laterality: N/A;  . CERVICAL DISC SURGERY    . CHOLECYSTECTOMY    . LUMBAR SPINE SURGERY    . TOTAL KNEE ARTHROPLASTY    .  TOTAL SHOULDER ARTHROPLASTY Left 06/09/2017   Procedure: Left shoulder anatomic total shoulder arthroplasty;  Surgeon: Beverely Low, MD;  Location: [facility] OR;  Service: Orthopedics;  Laterality: Left;  . TOTAL SHOULDER REPLACEMENT      Current Outpatient Medications  Medication Sig Dispense Refill  . albuterol (PROVENTIL HFA;VENTOLIN HFA) 108 (90 BASE) MCG/ACT inhaler Inhale 1-2 puffs into the lungs every 6 (six) hours as needed for wheezing or shortness of breath.     Marland Kitchen aspirin EC 81 MG tablet Take 81 mg by mouth daily.    . benazepril (LOTENSIN) 20 MG tablet Take 20 mg by mouth See admin instructions. Usually takes once daily but blood pressure has been running high and pt will take it twice daily as needed  2  . EPINEPHrine (EPIPEN 2-PAK) 0.3 mg/0.3 mL IJ SOAJ injection Inject 0.3 mg into the muscle once.    Marland Kitchen esomeprazole (NEXIUM) 40 MG packet Take 40 mg daily before breakfast by mouth.    . methocarbamol (ROBAXIN) 500 MG tablet Take 500 mg by mouth every 6 (six) hours as needed for muscle spasms.    Marland Kitchen OVER THE COUNTER MEDICATION Place 1 application onto the skin as needed.     No current facility-administered medications for this visit.    Allergies:  Bee venom; Cephalexin; Codeine; Flexeril [cyclobenzaprine]; Iodine; Latex; Lidocaine; Morphine; Neurontin [gabapentin]; Oxycodone; Penicillins; Wellbutrin [bupropion hcl];  Gluten; Elavil [amitriptyline hcl]; and Tobramycin   Social History: The patient  reports that she has been smoking cigarettes.  She has a 15.00 pack-year smoking history. She has never used smokeless tobacco. She reports that she does not drink alcohol or use drugs.   ROS:  Please see the history of present illness. Otherwise, complete review of systems is positive for limited range of motion left shoulder.  All other systems are reviewed and negative.   Physical Exam: VS:  BP 129/85   Pulse 94   Ht  (1.753 m)   Wt 262 lb (118.8 kg)   SpO2 95%   BMI 38.69 kg/m ,  BMI Body mass index is 38.69 kg/m.  Wt Readings from Last 3 Encounters:  04/11/18 262 lb (118.8 kg)  03/01/18 250 lb (113.4 kg)  06/28/17 230 lb (104.3 kg)    General: Patient appears comfortable at rest. HEENT: Conjunctiva and lids normal, oropharynx clear. Neck: Supple, no elevated JVP or carotid bruits, no thyromegaly. Lungs: Clear to auscultation, nonlabored breathing at rest. Cardiac: Regular rate and rhythm, no S3 or significant systolic murmur, no pericardial rub. Abdomen: Soft, nontender, bowel sounds present. Extremities: No pitting edema, distal pulses 2+. Skin: Warm and dry. Musculoskeletal: No kyphosis. Neuropsychiatric: Alert and oriented x3, affect grossly appropriate.  ECG: I personally reviewed the tracing from 03/02/2018 which showed sinus rhythm with left atrial enlargement.  Recent Labwork: 03/01/2018: ALT 18; AST 19; BUN 17; Creatinine, Ser 0.67; Hemoglobin 15.9; Platelets 277; Potassium 4.1; Sodium 134   Other Studies Reviewed Today:  Cardiac catheterization 05/13/2016:  Prox RCA lesion, 50% stenosed.  Mid LAD lesion, 30% stenosed.  Mid Cx lesion, 10% stenosed.  The left ventricular systolic function is normal.  Assessment and Plan:  1.  History of mild to moderate nonobstructive CAD, asymptomatic at this time.  Recommended low-dose aspirin, otherwise continue ACE inhibitor.  I did talk with her about statin therapy, but she prefers to hold off.  Continue observation for now.  2.  Tobacco abuse.  She is using nicotine patches in an attempt to quit.  States that she is motivated.  3.  Essential hypertension, on Lotensin.  Keep follow-up with Dr. Juanetta Gosling.  4.  Reported history of hyperlipidemia.  LDL in the 160s as of 2017.  I have asked her to follow-up on this with Dr. Juanetta Gosling and have recommended statin therapy for further risk reduction.  She states that she would prefer to hold off on this.  Current medicines were reviewed with the patient  today.  Disposition: Follow-up in 1 year.  Signed, Jonelle Sidle, MD, [facility] 04/11/2018 8:58 AM    [facility] [address] [place], [place] [address] Phone: [phone]; Fax: [phone]

## 2018-04-11 ENCOUNTER — Encounter: Payer: Self-pay | Admitting: Cardiology

## 2018-04-11 ENCOUNTER — Ambulatory Visit (INDEPENDENT_AMBULATORY_CARE_PROVIDER_SITE_OTHER): Payer: Medicare Other | Admitting: Cardiology

## 2018-04-11 VITALS — BP 129/85 | HR 94 | Ht 69.0 in | Wt 262.0 lb

## 2018-04-11 DIAGNOSIS — Z720 Tobacco use: Secondary | ICD-10-CM

## 2018-04-11 DIAGNOSIS — I10 Essential (primary) hypertension: Secondary | ICD-10-CM

## 2018-04-11 DIAGNOSIS — I2510 Atherosclerotic heart disease of native coronary artery without angina pectoris: Secondary | ICD-10-CM | POA: Diagnosis not present

## 2018-04-11 DIAGNOSIS — E782 Mixed hyperlipidemia: Secondary | ICD-10-CM | POA: Diagnosis not present

## 2018-04-11 NOTE — Patient Instructions (Signed)
Medication Instructions:  Start ASPIRIN 81 MG DAILY   Labwork: NONE  Testing/Procedures: NONE  Follow-Up: Your physician wants you to follow-up in: 1 YEAR.  You will receive a reminder letter in the mail two months in advance. If you don't receive a letter, please call our office to schedule the follow-up appointment.   Any Other Special Instructions Will Be Listed Below (If Applicable).     If you need a refill on your cardiac medications before your next appointment, please call your pharmacy.

## 2018-07-30 DIAGNOSIS — N390 Urinary tract infection, site not specified: Secondary | ICD-10-CM | POA: Diagnosis not present

## 2018-09-13 DIAGNOSIS — H524 Presbyopia: Secondary | ICD-10-CM | POA: Diagnosis not present

## 2018-09-13 DIAGNOSIS — H5213 Myopia, bilateral: Secondary | ICD-10-CM | POA: Diagnosis not present

## 2018-09-13 DIAGNOSIS — H52203 Unspecified astigmatism, bilateral: Secondary | ICD-10-CM | POA: Diagnosis not present
